# Patient Record
Sex: Female | Born: 1935 | Race: Black or African American | Hispanic: No | Smoking: Never smoker
Health system: Southern US, Community
[De-identification: ages and names within clinical notes are randomized; demographics above are authoritative.]

## PROBLEM LIST (undated history)

## (undated) DIAGNOSIS — R410 Disorientation, unspecified: Secondary | ICD-10-CM

## (undated) DIAGNOSIS — N179 Acute kidney failure, unspecified: Secondary | ICD-10-CM

## (undated) DIAGNOSIS — I10 Essential (primary) hypertension: Secondary | ICD-10-CM

## (undated) DIAGNOSIS — E348 Other specified endocrine disorders: Secondary | ICD-10-CM

## (undated) DIAGNOSIS — I4891 Unspecified atrial fibrillation: Secondary | ICD-10-CM

## (undated) DIAGNOSIS — R001 Bradycardia, unspecified: Secondary | ICD-10-CM

## (undated) DIAGNOSIS — M1990 Unspecified osteoarthritis, unspecified site: Secondary | ICD-10-CM

## (undated) HISTORY — DX: Unspecified atrial fibrillation: I48.91

## (undated) HISTORY — DX: Other specified endocrine disorders: E34.8

## (undated) HISTORY — DX: Bradycardia, unspecified: R00.1

## (undated) HISTORY — DX: Acute kidney failure, unspecified: N17.9

---

## 1998-06-13 ENCOUNTER — Ambulatory Visit (HOSPITAL_COMMUNITY): Admission: RE | Admit: 1998-06-13 | Discharge: 1998-06-13 | Payer: Self-pay | Admitting: Internal Medicine

## 1998-06-13 ENCOUNTER — Encounter: Payer: Self-pay | Admitting: Internal Medicine

## 2001-09-08 ENCOUNTER — Other Ambulatory Visit: Admission: RE | Admit: 2001-09-08 | Discharge: 2001-09-08 | Payer: Self-pay | Admitting: Internal Medicine

## 2002-10-27 ENCOUNTER — Emergency Department (HOSPITAL_COMMUNITY): Admission: EM | Admit: 2002-10-27 | Discharge: 2002-10-27 | Payer: Self-pay | Admitting: Emergency Medicine

## 2002-10-27 ENCOUNTER — Encounter: Payer: Self-pay | Admitting: Emergency Medicine

## 2003-07-05 ENCOUNTER — Encounter: Payer: Self-pay | Admitting: Emergency Medicine

## 2003-07-05 ENCOUNTER — Encounter: Payer: Self-pay | Admitting: Orthopedic Surgery

## 2003-07-05 ENCOUNTER — Observation Stay (HOSPITAL_COMMUNITY): Admission: EM | Admit: 2003-07-05 | Discharge: 2003-07-06 | Payer: Self-pay | Admitting: Emergency Medicine

## 2003-08-10 ENCOUNTER — Encounter: Admission: RE | Admit: 2003-08-10 | Discharge: 2003-11-08 | Payer: Self-pay | Admitting: Orthopedic Surgery

## 2012-09-22 ENCOUNTER — Other Ambulatory Visit: Payer: Self-pay | Admitting: Internal Medicine

## 2012-09-22 ENCOUNTER — Ambulatory Visit
Admission: RE | Admit: 2012-09-22 | Discharge: 2012-09-22 | Disposition: A | Discharge status: Home / Self Care | Payer: Self-pay | Source: Ambulatory Visit | Attending: Internal Medicine | Admitting: Internal Medicine

## 2012-09-22 DIAGNOSIS — M79673 Pain in unspecified foot: Secondary | ICD-10-CM

## 2013-04-01 ENCOUNTER — Emergency Department (HOSPITAL_COMMUNITY)
Admission: EM | Admit: 2013-04-01 | Discharge: 2013-04-01 | Discharge status: Against Medical Advice | Payer: Medicare Other | Attending: Emergency Medicine | Admitting: Emergency Medicine

## 2013-04-01 ENCOUNTER — Encounter (HOSPITAL_COMMUNITY): Payer: Self-pay | Admitting: Emergency Medicine

## 2013-04-01 ENCOUNTER — Emergency Department (HOSPITAL_COMMUNITY): Payer: Medicare Other

## 2013-04-01 DIAGNOSIS — I10 Essential (primary) hypertension: Secondary | ICD-10-CM | POA: Insufficient documentation

## 2013-04-01 DIAGNOSIS — J00 Acute nasopharyngitis [common cold]: Secondary | ICD-10-CM | POA: Insufficient documentation

## 2013-04-01 DIAGNOSIS — R6883 Chills (without fever): Secondary | ICD-10-CM | POA: Insufficient documentation

## 2013-04-01 DIAGNOSIS — N390 Urinary tract infection, site not specified: Secondary | ICD-10-CM | POA: Insufficient documentation

## 2013-04-01 DIAGNOSIS — M7989 Other specified soft tissue disorders: Secondary | ICD-10-CM | POA: Insufficient documentation

## 2013-04-01 DIAGNOSIS — R05 Cough: Secondary | ICD-10-CM | POA: Insufficient documentation

## 2013-04-01 DIAGNOSIS — R001 Bradycardia, unspecified: Secondary | ICD-10-CM

## 2013-04-01 DIAGNOSIS — R059 Cough, unspecified: Secondary | ICD-10-CM | POA: Insufficient documentation

## 2013-04-01 DIAGNOSIS — Z8739 Personal history of other diseases of the musculoskeletal system and connective tissue: Secondary | ICD-10-CM | POA: Insufficient documentation

## 2013-04-01 DIAGNOSIS — I5023 Acute on chronic systolic (congestive) heart failure: Secondary | ICD-10-CM | POA: Insufficient documentation

## 2013-04-01 HISTORY — DX: Unspecified osteoarthritis, unspecified site: M19.90

## 2013-04-01 HISTORY — DX: Essential (primary) hypertension: I10

## 2013-04-01 LAB — COMPREHENSIVE METABOLIC PANEL
ALT: 14 U/L (ref 0–35)
AST: 29 U/L (ref 0–37)
Alkaline Phosphatase: 50 U/L (ref 39–117)
CO2: 33 mEq/L — ABNORMAL HIGH (ref 19–32)
Calcium: 9 mg/dL (ref 8.4–10.5)
Chloride: 98 mEq/L (ref 96–112)
GFR calc non Af Amer: 33 mL/min — ABNORMAL LOW (ref >90)
Potassium: 3.3 mEq/L — ABNORMAL LOW (ref 3.5–5.1)
Sodium: 140 mEq/L (ref 135–145)
Total Bilirubin: 0.5 mg/dL (ref 0.3–1.2)

## 2013-04-01 LAB — URINALYSIS, ROUTINE W REFLEX MICROSCOPIC
Glucose, UA: NEGATIVE mg/dL
Ketones, ur: NEGATIVE mg/dL
pH: 7 (ref 5.0–8.0)

## 2013-04-01 LAB — URINE MICROSCOPIC-ADD ON

## 2013-04-01 LAB — CBC WITH DIFFERENTIAL/PLATELET
Basophils Absolute: 0 10*3/uL (ref 0.0–0.1)
Lymphocytes Relative: 22 % (ref 12–46)
Lymphs Abs: 0.8 10*3/uL (ref 0.7–4.0)
Neutro Abs: 2.2 10*3/uL (ref 1.7–7.7)
Neutrophils Relative %: 62 % (ref 43–77)
Platelets: 154 10*3/uL (ref 150–400)
RBC: 3.83 MIL/uL — ABNORMAL LOW (ref 3.87–5.11)
RDW: 15 % (ref 11.5–15.5)
WBC: 3.5 10*3/uL — ABNORMAL LOW (ref 4.0–10.5)

## 2013-04-01 MED ORDER — DEXTROSE 5 % IV SOLN
1.0000 g | Freq: Once | INTRAVENOUS | Status: AC
  Administered 2013-04-01: 1 g via INTRAVENOUS
  Filled 2013-04-01: qty 10

## 2013-04-01 MED ORDER — NITROFURANTOIN MONOHYD MACRO 100 MG PO CAPS: 100.0000 mg | ORAL_CAPSULE | Freq: Two times a day (BID) | ORAL | Status: DC

## 2013-04-01 NOTE — ED Notes (Addendum)
Per EMS: pt from home alone; pt sts she was cold per EMS; pt noted to be regular and brady; pt hypertensive; pt denies pain or dizziness; pt noted to have pitting edema to bilateral legs

## 2013-04-01 NOTE — ED Notes (Signed)
MD at bedside. 

## 2013-04-01 NOTE — ED Notes (Signed)
Pt leaving AMA with Pts daughter. Dr. Blinda Leatherwood, Charge Nurse and Myself reviewed risk going home and reasons for admission. Daughter states she will take her to her primary care, Dr. Girard Cooter after discharge.

## 2013-04-01 NOTE — ED Notes (Signed)
Pt gave permission for [facility] to call PCP.

## 2013-04-01 NOTE — ED Provider Notes (Signed)
History     CSN: [id number]  Arrival date & time 04/01/13  0802   First MD Initiated Contact with Patient 04/01/13 [phone]      Chief Complaint  Patient presents with  . Weakness    (Consider location/radiation/quality/duration/timing/severity/associated sxs/prior treatment) HPI Comments: Patient presents today for evaluation of weakness. The patient reports that she took this morning and felt very cold, had chills. She says it was clearly in her room, the air was blowing on her. She could not get warm, however. She noticed that she was having cough. She is not short of breath. She does, however, report that she has noticed progressively worsening swelling of her legs in the recent months. Patient got to the ER by EMS. They report the patient has been hypertensive during transport. The patient denies headache, chest pain.  Patient is a 79 y.o. female presenting with weakness.  Weakness Pertinent negatives include no chest pain.    Past Medical History  Diagnosis Date  . Hypertension   . Arthritis     History reviewed. No pertinent past surgical history.  History reviewed. No pertinent family history.  History  Substance Use Topics  . Smoking status: Never Smoker   . Smokeless tobacco: Not on file  . Alcohol Use: No    OB History   Grav Para Term Preterm Abortions TAB SAB Ect Mult Living                  Review of Systems  Constitutional: Positive for chills.  Respiratory: Positive for cough.   Cardiovascular: Positive for leg swelling. Negative for chest pain.  Gastrointestinal: Negative.   Neurological: Positive for weakness.  All other systems reviewed and are negative.    Allergies  Review of patient's allergies indicates no known allergies.  Home Medications  No current outpatient prescriptions on file.  BP 187/79  Pulse 46  Temp(Src) 97.1 F (36.2 C) (Oral)  Resp 18  SpO2 98%  Physical Exam  Constitutional: She is oriented to person, place, and  time. She appears well-developed and well-nourished. No distress.  HENT:  Head: Normocephalic and atraumatic.  Right Ear: Hearing normal.  Left Ear: Hearing normal.  Nose: Nose normal.  Mouth/Throat: Oropharynx is clear and moist and mucous membranes are normal.  Eyes: Conjunctivae and EOM are normal. Pupils are equal, round, and reactive to light.  Neck: Normal range of motion. Neck supple.  Cardiovascular: Regular rhythm, S1 normal and S2 normal.  Exam reveals no gallop and no friction rub.   No murmur heard. Pulmonary/Chest: Effort normal. No accessory muscle usage. Not tachypneic. No respiratory distress. She has rhonchi in the right lower field and the left lower field. She exhibits no tenderness.  Abdominal: Soft. Normal appearance and bowel sounds are normal. There is no hepatosplenomegaly. There is no tenderness. There is no rebound, no guarding, no tenderness at McBurney's point and negative Murphy's sign. No hernia.  Musculoskeletal: Normal range of motion. She exhibits edema.  Bilateral 4+ pitting edema  Neurological: She is alert and oriented to person, place, and time. She has normal strength. No cranial nerve deficit or sensory deficit. Coordination normal. GCS eye subscore is 4. GCS verbal subscore is 5. GCS motor subscore is 6.  Skin: Skin is warm, dry and intact. No rash noted. No cyanosis.  Psychiatric: She has a normal mood and affect. Her speech is normal and behavior is normal. Thought content normal.    ED Course  Procedures (including critical care time)  EKG:  Date: 04/01/2013  Rate: 44  Rhythm: sinus bradycardia and premature ventricular contractions (PVC)  QRS Axis: normal  Intervals: normal  ST/T Wave abnormalities: normal  Conduction Disutrbances:none  Narrative Interpretation:   Old EKG Reviewed: Bradycardia more pronounced    Labs Reviewed  CBC WITH DIFFERENTIAL - Abnormal; Notable for the following:    WBC 3.5 (*)    RBC 3.83 (*)    Hemoglobin  11.5 (*)    HCT 34.7 (*)    All other components within normal limits  COMPREHENSIVE METABOLIC PANEL - Abnormal; Notable for the following:    Potassium 3.3 (*)    CO2 33 (*)    BUN 34 (*)    Creatinine, Ser 1.34 (*)    Albumin 3.4 (*)    GFR calc non Af Amer 33 (*)    GFR calc Af Amer 38 (*)    All other components within normal limits  PRO B NATRIURETIC PEPTIDE - Abnormal; Notable for the following:    Pro B Natriuretic peptide (BNP) 497.3 (*)    All other components within normal limits  URINALYSIS, ROUTINE W REFLEX MICROSCOPIC - Abnormal; Notable for the following:    APPearance CLOUDY (*)    Hgb urine dipstick Wahler (*)    Leukocytes, UA LARGE (*)    All other components within normal limits  URINE MICROSCOPIC-ADD ON - Abnormal; Notable for the following:    Bacteria, UA FEW (*)    All other components within normal limits  URINE CULTURE  TROPONIN I   Dg Chest 2 View  04/01/2013   *RADIOLOGY REPORT*  Clinical Data: Cough and chills  CHEST - 2 VIEW  Comparison: None.  Findings: The heart and pulmonary vascularity are within normal limits.  The lungs are well-aerated bilaterally without focal infiltrate or sizable effusion.  Mild increased kyphosis is noted of a degenerative nature.  IMPRESSION: No acute abnormalities seen.   Original Report Authenticated By: Alcide Clever, M.D.     Diagnosis: 1. UTI 2. CHF 3. Bradycardia   MDM  Patient presents to the ER by ambulance. Patient is 79 years, lives alone with her daughter helping take care of her. Patient awoke this morning and said she was very cold, apparently called EMS herself. At arrival she is noted to have significant pitting edema of both lower extremities. She was also noted to be profoundly bradycardic.workup was initiated. No family members were present for the first 4 hours the patient was in the ER.  Patient is found to have urinary tract infection. Rocephin was initiated for this. BNP is somewhat elevated. She does  not have evidence of pulmonary edema, but patient has 4+ pitting edema of both lower extremities.additionally, patient's heart rate is 40-42 beats per minute. She does take Toprol XL, but she has not taken her dose this morning.  Based on the patient's bradycardia, congestive heart failure with uncontrolled blood pressure and urinary tract infection, I felt the patient should be hospitalized. Case was discussed with the hospitalist who agreed to the admission. At this point in time, however, patient's daughter finally has arrived in the ER and is demanding that the patient go home.  I had a lengthy conversation with the patient and her daughter. I explained that the patient's bradycardia could lead her to be unstable, possibly fall, hit her head. She completed her brain and died. Her heart to stop as well because she has congestive heart failure and is at risk for ventricular tachycardia. I feel that her  medications are not currently appropriately dosed for her, as she is profoundly bradycardic. She should not take any further Toprol-XL. She also has not compensated congestive heart failure on 20 mg of Lasix daily.  I had a conversation with the patient and her daughter multiple times. Each time I implored them to be admitted to the hospital. I did tell the daughter that I was concerned that the patient would die hospital. Patient and daughter indicated understanding of this. Because the patient's dementia I felt that it was important for the daughter to indicate that she understood the danger of taking her mother home. The daughter indicates that she is an LPN and understands the situation.  At this point I tried to contact the patient's primary care doctor, Doctor Renae Gloss. I was unable to get through to the office to inform her of the patient's current condition and how her health maintenance the daughter to be admitted in the hospital, or at least to be aware of the problems to address at  followup.  Unfortunately, patient does not have a power of attorney. The daughter indicates that she is the only living relative but does not have legal power of attorney. Patient is demented and does not appear to have the capacity to make decisions entirely on her own behalf.  Because of this I did discuss the case briefly with case management, the charge nurse and asked plan to help determine any legal obligations on her part. I was informed that we could not make the patient's stay in the hospital and therefore have asked the patient's daughter and the patient to sign out AGAINST MEDICAL ADVICE.    Gilda Crease, MD 04/01/13 1316

## 2013-04-02 LAB — URINE CULTURE

## 2014-11-25 DIAGNOSIS — R54 Age-related physical debility: Secondary | ICD-10-CM | POA: Diagnosis not present

## 2014-11-25 DIAGNOSIS — D518 Other vitamin B12 deficiency anemias: Secondary | ICD-10-CM | POA: Diagnosis not present

## 2014-11-25 DIAGNOSIS — I110 Hypertensive heart disease with heart failure: Secondary | ICD-10-CM | POA: Diagnosis not present

## 2014-11-25 DIAGNOSIS — N183 Chronic kidney disease, stage 3 (moderate): Secondary | ICD-10-CM | POA: Diagnosis not present

## 2014-11-25 DIAGNOSIS — F0280 Dementia in other diseases classified elsewhere without behavioral disturbance: Secondary | ICD-10-CM | POA: Diagnosis not present

## 2014-11-25 DIAGNOSIS — R627 Adult failure to thrive: Secondary | ICD-10-CM | POA: Diagnosis not present

## 2014-11-25 DIAGNOSIS — R410 Disorientation, unspecified: Secondary | ICD-10-CM | POA: Diagnosis not present

## 2014-11-25 DIAGNOSIS — R634 Abnormal weight loss: Secondary | ICD-10-CM | POA: Diagnosis not present

## 2014-11-27 ENCOUNTER — Inpatient Hospital Stay (HOSPITAL_COMMUNITY)
Admission: EM | Admit: 2014-11-27 | Discharge: 2014-11-30 | DRG: 092 | Disposition: A | Discharge status: Home / Self Care | Payer: Medicare Other | Attending: Family Medicine | Admitting: Family Medicine

## 2014-11-27 ENCOUNTER — Emergency Department (HOSPITAL_COMMUNITY): Payer: Medicare Other

## 2014-11-27 ENCOUNTER — Encounter (HOSPITAL_COMMUNITY): Payer: Self-pay

## 2014-11-27 DIAGNOSIS — M1990 Unspecified osteoarthritis, unspecified site: Secondary | ICD-10-CM | POA: Diagnosis present

## 2014-11-27 DIAGNOSIS — I129 Hypertensive chronic kidney disease with stage 1 through stage 4 chronic kidney disease, or unspecified chronic kidney disease: Secondary | ICD-10-CM | POA: Diagnosis present

## 2014-11-27 DIAGNOSIS — G92 Toxic encephalopathy: Secondary | ICD-10-CM | POA: Diagnosis not present

## 2014-11-27 DIAGNOSIS — M7500 Adhesive capsulitis of unspecified shoulder: Secondary | ICD-10-CM | POA: Diagnosis not present

## 2014-11-27 DIAGNOSIS — R4182 Altered mental status, unspecified: Secondary | ICD-10-CM | POA: Diagnosis not present

## 2014-11-27 DIAGNOSIS — E875 Hyperkalemia: Secondary | ICD-10-CM | POA: Diagnosis present

## 2014-11-27 DIAGNOSIS — E872 Acidosis: Secondary | ICD-10-CM | POA: Diagnosis present

## 2014-11-27 DIAGNOSIS — N184 Chronic kidney disease, stage 4 (severe): Secondary | ICD-10-CM | POA: Diagnosis present

## 2014-11-27 DIAGNOSIS — E876 Hypokalemia: Secondary | ICD-10-CM | POA: Diagnosis not present

## 2014-11-27 DIAGNOSIS — I10 Essential (primary) hypertension: Secondary | ICD-10-CM | POA: Diagnosis not present

## 2014-11-27 DIAGNOSIS — R410 Disorientation, unspecified: Secondary | ICD-10-CM | POA: Diagnosis not present

## 2014-11-27 DIAGNOSIS — E870 Hyperosmolality and hypernatremia: Secondary | ICD-10-CM | POA: Diagnosis not present

## 2014-11-27 DIAGNOSIS — R220 Localized swelling, mass and lump, head: Secondary | ICD-10-CM | POA: Diagnosis not present

## 2014-11-27 DIAGNOSIS — Z8249 Family history of ischemic heart disease and other diseases of the circulatory system: Secondary | ICD-10-CM

## 2014-11-27 DIAGNOSIS — Z23 Encounter for immunization: Secondary | ICD-10-CM

## 2014-11-27 DIAGNOSIS — N179 Acute kidney failure, unspecified: Secondary | ICD-10-CM | POA: Diagnosis not present

## 2014-11-27 DIAGNOSIS — G928 Other toxic encephalopathy: Secondary | ICD-10-CM | POA: Diagnosis present

## 2014-11-27 DIAGNOSIS — F0390 Unspecified dementia without behavioral disturbance: Secondary | ICD-10-CM | POA: Diagnosis not present

## 2014-11-27 DIAGNOSIS — E348 Other specified endocrine disorders: Secondary | ICD-10-CM | POA: Diagnosis present

## 2014-11-27 DIAGNOSIS — E860 Dehydration: Secondary | ICD-10-CM | POA: Diagnosis present

## 2014-11-27 DIAGNOSIS — Z79899 Other long term (current) drug therapy: Secondary | ICD-10-CM | POA: Diagnosis not present

## 2014-11-27 DIAGNOSIS — R001 Bradycardia, unspecified: Secondary | ICD-10-CM | POA: Diagnosis not present

## 2014-11-27 DIAGNOSIS — Z7982 Long term (current) use of aspirin: Secondary | ICD-10-CM

## 2014-11-27 HISTORY — DX: Disorientation, unspecified: R41.0

## 2014-11-27 LAB — COMPREHENSIVE METABOLIC PANEL
ALK PHOS: 35 U/L — AB (ref 39–117)
ALT: 19 U/L (ref 0–35)
AST: 39 U/L — ABNORMAL HIGH (ref 0–37)
Albumin: 3.9 g/dL (ref 3.5–5.2)
Anion gap: 14 (ref 5–15)
BUN: 152 mg/dL — AB (ref 6–23)
CO2: 35 mmol/L — AB (ref 19–32)
CREATININE: 3.71 mg/dL — AB (ref 0.50–1.10)
Calcium: 8.9 mg/dL (ref 8.4–10.5)
Chloride: 83 mmol/L — ABNORMAL LOW (ref 96–112)
GFR, EST AFRICAN AMERICAN: 11 mL/min — AB (ref >90)
GFR, EST NON AFRICAN AMERICAN: 10 mL/min — AB (ref >90)
GLUCOSE: 185 mg/dL — AB (ref 70–99)
POTASSIUM: 3.1 mmol/L — AB (ref 3.5–5.1)
SODIUM: 132 mmol/L — AB (ref 135–145)
TOTAL PROTEIN: 6.9 g/dL (ref 6.0–8.3)
Total Bilirubin: 0.9 mg/dL (ref 0.3–1.2)

## 2014-11-27 LAB — CBC WITH DIFFERENTIAL/PLATELET
BASOS PCT: 1 % (ref 0–1)
Basophils Absolute: 0 10*3/uL (ref 0.0–0.1)
EOS ABS: 0 10*3/uL (ref 0.0–0.7)
Eosinophils Relative: 1 % (ref 0–5)
HCT: 34.5 % — ABNORMAL LOW (ref 36.0–46.0)
Hemoglobin: 11.4 g/dL — ABNORMAL LOW (ref 12.0–15.0)
Lymphocytes Relative: 19 % (ref 12–46)
Lymphs Abs: 0.8 10*3/uL (ref 0.7–4.0)
MCH: 30.3 pg (ref 26.0–34.0)
MCHC: 33 g/dL (ref 30.0–36.0)
MCV: 91.8 fL (ref 78.0–100.0)
MONO ABS: 0.3 10*3/uL (ref 0.1–1.0)
MONOS PCT: 7 % (ref 3–12)
NEUTROS ABS: 2.9 10*3/uL (ref 1.7–7.7)
NEUTROS PCT: 72 % (ref 43–77)
Platelets: 134 10*3/uL — ABNORMAL LOW (ref 150–400)
RBC: 3.76 MIL/uL — ABNORMAL LOW (ref 3.87–5.11)
RDW: 14 % (ref 11.5–15.5)
WBC: 4 10*3/uL (ref 4.0–10.5)

## 2014-11-27 LAB — CBG MONITORING, ED: Glucose-Capillary: 162 mg/dL — ABNORMAL HIGH (ref 70–99)

## 2014-11-27 LAB — URINALYSIS, ROUTINE W REFLEX MICROSCOPIC
BILIRUBIN URINE: NEGATIVE
GLUCOSE, UA: NEGATIVE mg/dL
KETONES UR: NEGATIVE mg/dL
Leukocytes, UA: NEGATIVE
Nitrite: NEGATIVE
PROTEIN: NEGATIVE mg/dL
Specific Gravity, Urine: 1.011 (ref 1.005–1.030)
Urobilinogen, UA: 0.2 mg/dL (ref 0.0–1.0)
pH: 6 (ref 5.0–8.0)

## 2014-11-27 LAB — I-STAT CG4 LACTIC ACID, ED: LACTIC ACID, VENOUS: 1.54 mmol/L (ref 0.5–2.0)

## 2014-11-27 LAB — URINE MICROSCOPIC-ADD ON

## 2014-11-27 MED ORDER — INFLUENZA VAC SPLIT QUAD 0.5 ML IM SUSY
0.5000 mL | PREFILLED_SYRINGE | INTRAMUSCULAR | Status: AC
  Administered 2014-11-28: 0.5 mL via INTRAMUSCULAR
  Filled 2014-11-27 (×2): qty 0.5

## 2014-11-27 MED ORDER — SODIUM CHLORIDE 0.9 % IV SOLN
INTRAVENOUS | Status: DC
  Administered 2014-11-27: 17:00:00 via INTRAVENOUS

## 2014-11-27 MED ORDER — PNEUMOCOCCAL VAC POLYVALENT 25 MCG/0.5ML IJ INJ
0.5000 mL | INJECTION | INTRAMUSCULAR | Status: AC
  Administered 2014-11-28: 0.5 mL via INTRAMUSCULAR
  Filled 2014-11-27 (×2): qty 0.5

## 2014-11-27 MED ORDER — SODIUM CHLORIDE 0.9 % IV SOLN
INTRAVENOUS | Status: DC
  Administered 2014-11-28 – 2014-11-29 (×4): via INTRAVENOUS

## 2014-11-27 MED ORDER — HEPARIN SODIUM (PORCINE) 5000 UNIT/ML IJ SOLN
5000.0000 [IU] | Freq: Three times a day (TID) | INTRAMUSCULAR | Status: DC
  Administered 2014-11-27 – 2014-11-30 (×8): 5000 [IU] via SUBCUTANEOUS
  Filled 2014-11-27 (×8): qty 1

## 2014-11-27 MED ORDER — ASPIRIN EC 81 MG PO TBEC
81.0000 mg | DELAYED_RELEASE_TABLET | Freq: Every day | ORAL | Status: DC
  Administered 2014-11-27 – 2014-11-30 (×4): 81 mg via ORAL
  Filled 2014-11-27 (×4): qty 1

## 2014-11-27 MED ORDER — AMLODIPINE BESYLATE 5 MG PO TABS
2.5000 mg | ORAL_TABLET | Freq: Every day | ORAL | Status: DC
  Administered 2014-11-28 – 2014-11-29 (×2): 2.5 mg via ORAL
  Filled 2014-11-27 (×2): qty 1

## 2014-11-27 MED ORDER — POTASSIUM CHLORIDE CRYS ER 20 MEQ PO TBCR
40.0000 meq | EXTENDED_RELEASE_TABLET | Freq: Once | ORAL | Status: AC
Start: 2014-11-27 — End: 2014-11-27
  Administered 2014-11-27: 40 meq via ORAL
  Filled 2014-11-27: qty 2

## 2014-11-27 MED ORDER — CETYLPYRIDINIUM CHLORIDE 0.05 % MT LIQD
7.0000 mL | Freq: Two times a day (BID) | OROMUCOSAL | Status: DC
  Administered 2014-11-27 – 2014-11-30 (×6): 7 mL via OROMUCOSAL

## 2014-11-27 MED ORDER — ENSURE COMPLETE PO LIQD
237.0000 mL | Freq: Two times a day (BID) | ORAL | Status: DC
  Administered 2014-11-28 – 2014-11-30 (×4): 237 mL via ORAL

## 2014-11-27 NOTE — ED Notes (Signed)
MD Belfi at bedside. 

## 2014-11-27 NOTE — ED Notes (Addendum)
Pt alert and answering questions appropriately. Warm blanket provided. Pt is sinus brady at rate of 50.  Family updated about awaiting results.

## 2014-11-27 NOTE — ED Provider Notes (Signed)
CSN: [id number]     Arrival date & time 11/27/14  1503 History   First MD Initiated Contact with Patient 11/27/14 1533     Chief Complaint  Patient presents with  . Altered Mental Status  . Abnormal Lab     (Consider location/radiation/quality/duration/timing/severity/associated sxs/prior Treatment) HPI Comments: Pt with a hx of HTN presents with confusion.  She lives at home alone, just down from her daughter.  Her daughter has noted her to have some increase in confusion since after Christmas with some worsening over last couple of weeks.  She has lost about 20 pounds since November due to decreased appetitie.  She says that pt has not been eating well.  Has been generally weaker, but still able to ambulate on her own with a cane which is her baseline.  Pt denies any pain.  No CP or SOB, no fevers or UTI symptoms.  No abd pain.  No n/v.  Some occassional loose stools.  Was seen by her PCP on Thursday and was called today and told that her labs were abnormal and that she needed fluids.  Patient is a 81 y.o. female presenting with altered mental status.  Altered Mental Status Associated symptoms: weakness (generalized)   Associated symptoms: no abdominal pain, no fever, no headaches, no nausea, no rash and no vomiting     Past Medical History  Diagnosis Date  . Hypertension   . Arthritis   . Confusion    Past Surgical History  Procedure Laterality Date  . Cesarean section     History reviewed. No pertinent family history. History  Substance Use Topics  . Smoking status: Never Smoker   . Smokeless tobacco: Not on file  . Alcohol Use: No   OB History    No data available     Review of Systems  Constitutional: Positive for activity change, appetite change, fatigue and unexpected weight change. Negative for fever, chills and diaphoresis.  HENT: Negative for congestion, rhinorrhea and sneezing.   Eyes: Negative.   Respiratory: Positive for cough (mild). Negative for chest  tightness and shortness of breath.   Cardiovascular: Negative for chest pain and leg swelling.  Gastrointestinal: Negative for nausea, vomiting, abdominal pain, diarrhea and blood in stool.  Genitourinary: Negative for frequency, hematuria, flank pain and difficulty urinating.  Musculoskeletal: Negative for back pain and arthralgias.  Skin: Negative for rash.  Neurological: Positive for weakness (generalized). Negative for dizziness, speech difficulty, numbness and headaches.      Allergies  Review of patient's allergies indicates no known allergies.  Home Medications   Prior to Admission medications   Medication Sig Start Date End Date Taking? Authorizing Provider  aspirin 81 MG tablet Take 81 mg by mouth daily.   Yes Historical Provider, MD  bumetanide (BUMEX) 2 MG tablet Take 2 mg by mouth daily.   Yes Historical Provider, MD  metoprolol succinate (TOPROL-XL) 100 MG 24 hr tablet Take 100 mg by mouth daily. Take with or immediately following a meal.   Yes Historical Provider, MD  valsartan-hydrochlorothiazide (DIOVAN-HCT) 320-25 MG per tablet Take 1 tablet by mouth daily.   Yes Historical Provider, MD  nitrofurantoin, macrocrystal-monohydrate, (MACROBID) 100 MG capsule Take 1 capsule (100 mg total) by mouth 2 (two) times daily. 04/01/13   Gilda Crease, MD   BP 115/56 mmHg  Pulse 55  Temp(Src) 97.7 F (36.5 C) (Oral)  Resp 12  SpO2 100% Physical Exam  Constitutional: She is oriented to person, place, and time. She appears  well-developed and well-nourished.  HENT:  Head: Normocephalic and atraumatic.  Eyes: Pupils are equal, round, and reactive to light.  Neck: Normal range of motion. Neck supple.  Cardiovascular: Normal rate, regular rhythm and normal heart sounds.   Pulmonary/Chest: Effort normal and breath sounds normal. No respiratory distress. She has no wheezes. She has no rales. She exhibits no tenderness.  Abdominal: Soft. Bowel sounds are normal. There is no  tenderness. There is no rebound and no guarding.  Musculoskeletal: Normal range of motion. She exhibits no edema.  Lymphadenopathy:    She has no cervical adenopathy.  Neurological: She is alert and oriented to person, place, and time.  Knows person/place/month/year, but not date. Motor 5/5 all extremities.  Sensation grossly intact to LT all extremities.  FTN intact, no pronator drift.  CN 2-12 grossly intact  Skin: Skin is warm and dry. No rash noted.  Psychiatric: She has a normal mood and affect.    ED Course  Procedures (including critical care time) Labs Review Labs Reviewed  COMPREHENSIVE METABOLIC PANEL - Abnormal; Notable for the following:    Sodium 132 (*)    Potassium 3.1 (*)    Chloride 83 (*)    CO2 35 (*)    Glucose, Bld 185 (*)    BUN 152 (*)    Creatinine, Ser 3.71 (*)    AST 39 (*)    Alkaline Phosphatase 35 (*)    GFR calc non Af Amer 10 (*)    GFR calc Af Amer 11 (*)    All other components within normal limits  CBC WITH DIFFERENTIAL/PLATELET - Abnormal; Notable for the following:    RBC 3.76 (*)    Hemoglobin 11.4 (*)    HCT 34.5 (*)    Platelets 134 (*)    All other components within normal limits  URINALYSIS, ROUTINE W REFLEX MICROSCOPIC - Abnormal; Notable for the following:    Hgb urine dipstick Delval (*)    All other components within normal limits  CBG MONITORING, ED - Abnormal; Notable for the following:    Glucose-Capillary 162 (*)    All other components within normal limits  URINE MICROSCOPIC-ADD ON  I-STAT CG4 LACTIC ACID, ED    Imaging Review Dg Chest 2 View  11/27/2014   CLINICAL DATA:  Increasing confusion over the past 1-2 months. Acute renal insufficiency. Hypernatremia. Hypokalemia. Current history of hypertension.  EXAM: CHEST  2 VIEW  COMPARISON:  04/01/2013.  FINDINGS: Cardiac silhouette upper normal in size. Thoracic aorta tortuous and atherosclerotic, unchanged. Hilar and mediastinal contours otherwise unremarkable. Lungs clear.  Bronchovascular markings normal. Pulmonary vascularity normal. No visible pleural effusions. No pneumothorax. Degenerative changes involving the thoracic spine and exaggeration of the usual thoracic kyphosis.  IMPRESSION: No acute cardiopulmonary disease.   Electronically Signed   By: Hulan Saashomas  Lawrence M.D.   On: 11/27/2014 17:07   Ct Head Wo Contrast  11/27/2014   CLINICAL DATA:  Confusion, altered mental status. History of hypertension.  EXAM: CT HEAD WITHOUT CONTRAST  TECHNIQUE: Contiguous axial images were obtained from the base of the skull through the vertex without intravenous contrast.  COMPARISON:  None.  FINDINGS: Mild cortical volume loss noted with proportional ventricular prominence. Areas of periventricular white matter hypodensity are most compatible with Sorg vessel ischemic change. No acute hemorrhage or infarct is identified. There is a calcified mass in the region of the pineal gland measuring 1.7 x 1.5 cm.  IMPRESSION: No acute intracranial finding. 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 Further evaluation at brain MRI with contrast is recommended. These results were called by telephone at the time of interpretation on 11/27/2014 at 5:14 pm to Dr. Shawna Orleans Shakeila Pfarr , who verbally acknowledged these results.   Electronically Signed   By: Christiana Pellant M.D.   On: 11/27/2014 17:15     EKG Interpretation None      Date: 11/27/2014  Rate: 52  Rhythm: indeterminate appears to be junctional rhythm  QRS Axis: normal  Intervals: normal  ST/T Wave abnormalities: nonspecific ST/T changes  Conduction Disutrbances:none  Narrative Interpretation:   Old EKG Reviewed: unchanged   MDM   Final diagnoses:  Dehydration  AKI (acute kidney injury)  Hypokalemia    Patient with a markedly elevated BUNs and elevated creatinine with acute  kidney injury as compared to her baseline creatinine. She also has some mild hypokalemia. She was given potassium replacement and started on IV fluids. She has no evidence of pneumonia. She has an abnormal mass in her pineal gland which will need further imaging with MRI. I spoke with Dr. Burnett Harry who will admit the patient for further treatment.    Rolan Bucco, MD 11/27/14 [phone]

## 2014-11-27 NOTE — ED Notes (Signed)
MD Samtani at bedside 

## 2014-11-27 NOTE — ED Notes (Signed)
Pt ambulated to restroom with the assistance of daughter and granddaughter

## 2014-11-27 NOTE — ED Notes (Addendum)
4th Floor reports they must reassign bed. Will await for new bed assignment.

## 2014-11-27 NOTE — ED Notes (Signed)
Pt sent by PCP, due to abnormal lab values.  Pt's daughter reports PCP visit, due to increased confusion since December.  PCP reported elevated BUN, creatinine, sodium, and decreased potassium.  Hx of confusion.

## 2014-11-27 NOTE — H&P (Signed)
[facility] History and Physical  Rachel Hickman:[id number] DOB: 01-26-1917 DOA: 11/27/2014  Referring physician: ED PCP: No primary care provider on file.  Specialists: none  Chief Complaint: Abnormal labs  HPI: pleasant 81 year old female with history of hypertension, lower extremity swelling,otherwise review bland past medical history admitted from home 11/27/14 after being called by nurse practitioner regarding abnormal labs that were performed on 11/25/14 Patient apparently has been increasingly confused since November which coincides with the time that the patient's Lasix was transitioned over to Bumex/  There is previous documentation that the patient came to the emergency room because patient is feeling cold and was found to be profoundly bradycardic. Patient currently takes Bumex 2 mg daily and has had an improvement in her lower extremity edema over the past month where she is finally able to put back on her shoes. Unfortunately patient also is on Diovan HCT which is a diuretic as well and has become increasingly confused At baseline she is able to participate in cooking and cleaning doing stuff for herself. She is unable to drive at present She is able to give me a history but thinks that this is 1958 and cannot tell me the president's name and this is not her baseline according to her daughter-confusion worsened over the last 1 month coinciding with bumex use  Review of Systems:  Unrelaiable  Past Medical History  Diagnosis Date  . Hypertension   . Arthritis   . Confusion    Past Surgical History  Procedure Laterality Date  . Cesarean section     Social History:  History   Social History Narrative    No Known Allergies  Family History  Problem Relation Age of Onset  . Hypertension       Prior to Admission medications   Medication Sig Start Date End Date Taking? Authorizing Provider  aspirin 81 MG tablet Take 81 mg by mouth daily.   Yes Historical Provider,  MD  bumetanide (BUMEX) 2 MG tablet Take 2 mg by mouth daily.   Yes Historical Provider, MD  metoprolol succinate (TOPROL-XL) 100 MG 24 hr tablet Take 100 mg by mouth daily. Take with or immediately following a meal.   Yes Historical Provider, MD  valsartan-hydrochlorothiazide (DIOVAN-HCT) 320-25 MG per tablet Take 1 tablet by mouth daily.   Yes Historical Provider, MD  nitrofurantoin, macrocrystal-monohydrate, (MACROBID) 100 MG capsule Take 1 capsule (100 mg total) by mouth 2 (two) times daily. 04/01/13   Gilda Crease, MD   Physical Exam: Filed Vitals:   11/27/14 1518 11/27/14 1745  BP: 115/56   Pulse: 55   Temp: 97.7 F (36.5 C)   TempSrc: Oral   Resp: 16 12  SpO2: 100%    EOM, NCAT Poor skin turgor Clear chest, no rales s1 s2 no m/r/g, no bruit, no jvd Arcus senilis Surprisingly good dentition No abd tenderness Neuro intact No Le edema   Labs on Admission:  Basic Metabolic Panel:  Recent Labs Lab 11/27/14 1549  NA 132*  K 3.1*  CL 83*  CO2 35*  GLUCOSE 185*  BUN 152*  CREATININE 3.71*  CALCIUM 8.9   Liver Function Tests:  Recent Labs Lab 11/27/14 1549  AST 39*  ALT 19  ALKPHOS 35*  BILITOT 0.9  PROT 6.9  ALBUMIN 3.9   No results for input(s): LIPASE, AMYLASE in the last 168 hours. No results for input(s): AMMONIA in the last 168 hours. CBC:  Recent Labs Lab 11/27/14 1549  WBC 4.0  NEUTROABS 2.9  HGB 11.4*  HCT 34.5*  MCV 91.8  PLT 134*   Cardiac Enzymes: No results for input(s): CKTOTAL, CKMB, CKMBINDEX, TROPONINI in the last 168 hours.  BNP (last 3 results) No results for input(s): BNP in the last 8760 hours.  ProBNP (last 3 results) No results for input(s): PROBNP in the last 8760 hours.  CBG:  Recent Labs Lab 11/27/14 1608  GLUCAP 162*    Radiological Exams on Admission: Dg Chest 2 View  11/27/2014   CLINICAL DATA:  Increasing confusion over the past 1-2 months. Acute renal insufficiency. Hypernatremia. Hypokalemia.  Current history of hypertension.  EXAM: CHEST  2 VIEW  COMPARISON:  04/01/2013.  FINDINGS: Cardiac silhouette upper normal in size. Thoracic aorta tortuous and atherosclerotic, unchanged. Hilar and mediastinal contours otherwise unremarkable. Lungs clear. Bronchovascular markings normal. Pulmonary vascularity normal. No visible pleural effusions. No pneumothorax. Degenerative changes involving the thoracic spine and exaggeration of the usual thoracic kyphosis.  IMPRESSION: No acute cardiopulmonary disease.   Electronically Signed   By: Hulan Saashomas  Lawrence M.D.   On: 11/27/2014 17:07   Ct Head Wo Contrast  11/27/2014   CLINICAL DATA:  Confusion, altered mental status. History of hypertension.  EXAM: CT HEAD WITHOUT CONTRAST  TECHNIQUE: Contiguous axial images were obtained from the base of the skull through the vertex without intravenous contrast.  COMPARISON:  None.  FINDINGS: Mild cortical volume loss noted with proportional ventricular prominence. Areas of periventricular white matter hypodensity are most compatible with Esters vessel ischemic change. No acute hemorrhage or infarct is identified. There is a calcified mass in the region of the pineal gland measuring 1.7 x 1.5 cm.  IMPRESSION: No acute intracranial finding. Calcified 1.7 cm mass in the region of the pineal gland, for which typical differential considerations include meningioma, pineoblastoma, germinoma or other germ-cell tumor, or metastatic disease. Further evaluation at brain MRI with contrast is recommended. These results were called by telephone at the time of interpretation on 11/27/2014 at 5:14 pm to Dr. Shawna OrleansMELANIE BELFI , who verbally acknowledged these results.   Electronically Signed   By: Christiana PellantGretchen  Green M.D.   On: 11/27/2014 17:15    EKG: Independently reviewed. Sinus brDY, NO PAUSES  Assessment/Plan Principal Problem:   Toxic metabolic encephalopathy-2/2 to AKI. Replete c saline, labs am, i/o. Daily weight Should not be d/c on  ACe/Bumex-- ? Heart failure? discussion c PCP as OP re: further work up ie echo etc etc Active Problems:   Acute kidney injury-see above.  Monitor fluid status carefully   Bradycardia-should not be d/c on Metoprolol xl 100.  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 SHe is understandably worried about her. Daughter is NOK as sole child, but doesn't;t hold HCPOA.  SHe agrees that mother ischronologically close to her natural end of lifespan.  We will continue discussions tomorrow She remains full code  Rhetta MuraSAMTANI, JAI-GURMUKH [facility] Pager [phone]  If 7PM-7AM, please contact night-coverage [email or web address] Password [email or web address] 11/27/2014, 6:18 PM

## 2014-11-28 DIAGNOSIS — M1990 Unspecified osteoarthritis, unspecified site: Secondary | ICD-10-CM | POA: Diagnosis not present

## 2014-11-28 DIAGNOSIS — I129 Hypertensive chronic kidney disease with stage 1 through stage 4 chronic kidney disease, or unspecified chronic kidney disease: Secondary | ICD-10-CM | POA: Diagnosis not present

## 2014-11-28 DIAGNOSIS — Z7982 Long term (current) use of aspirin: Secondary | ICD-10-CM | POA: Diagnosis not present

## 2014-11-28 DIAGNOSIS — E348 Other specified endocrine disorders: Secondary | ICD-10-CM | POA: Diagnosis not present

## 2014-11-28 DIAGNOSIS — E876 Hypokalemia: Secondary | ICD-10-CM | POA: Diagnosis not present

## 2014-11-28 DIAGNOSIS — E875 Hyperkalemia: Secondary | ICD-10-CM | POA: Diagnosis not present

## 2014-11-28 DIAGNOSIS — Z23 Encounter for immunization: Secondary | ICD-10-CM | POA: Diagnosis not present

## 2014-11-28 DIAGNOSIS — Z79899 Other long term (current) drug therapy: Secondary | ICD-10-CM | POA: Diagnosis not present

## 2014-11-28 DIAGNOSIS — N184 Chronic kidney disease, stage 4 (severe): Secondary | ICD-10-CM | POA: Diagnosis not present

## 2014-11-28 DIAGNOSIS — F0390 Unspecified dementia without behavioral disturbance: Secondary | ICD-10-CM | POA: Diagnosis not present

## 2014-11-28 DIAGNOSIS — G92 Toxic encephalopathy: Secondary | ICD-10-CM | POA: Diagnosis not present

## 2014-11-28 DIAGNOSIS — M7500 Adhesive capsulitis of unspecified shoulder: Secondary | ICD-10-CM | POA: Diagnosis not present

## 2014-11-28 DIAGNOSIS — N179 Acute kidney failure, unspecified: Secondary | ICD-10-CM | POA: Diagnosis not present

## 2014-11-28 DIAGNOSIS — Z8249 Family history of ischemic heart disease and other diseases of the circulatory system: Secondary | ICD-10-CM | POA: Diagnosis not present

## 2014-11-28 DIAGNOSIS — E860 Dehydration: Secondary | ICD-10-CM | POA: Diagnosis not present

## 2014-11-28 DIAGNOSIS — E872 Acidosis: Secondary | ICD-10-CM | POA: Diagnosis not present

## 2014-11-28 DIAGNOSIS — R001 Bradycardia, unspecified: Secondary | ICD-10-CM | POA: Diagnosis not present

## 2014-11-28 LAB — BASIC METABOLIC PANEL
Anion gap: 16 — ABNORMAL HIGH (ref 5–15)
BUN: 137 mg/dL — ABNORMAL HIGH (ref 6–23)
CO2: 33 mmol/L — ABNORMAL HIGH (ref 19–32)
CREATININE: 2.88 mg/dL — AB (ref 0.50–1.10)
Calcium: 8.9 mg/dL (ref 8.4–10.5)
Chloride: 88 mmol/L — ABNORMAL LOW (ref 96–112)
GFR calc Af Amer: 15 mL/min — ABNORMAL LOW (ref >90)
GFR calc non Af Amer: 13 mL/min — ABNORMAL LOW (ref >90)
Glucose, Bld: 104 mg/dL — ABNORMAL HIGH (ref 70–99)
Potassium: 3.3 mmol/L — ABNORMAL LOW (ref 3.5–5.1)
Sodium: 137 mmol/L (ref 135–145)

## 2014-11-28 LAB — CBC
HCT: 34.4 % — ABNORMAL LOW (ref 36.0–46.0)
HEMOGLOBIN: 11.4 g/dL — AB (ref 12.0–15.0)
MCH: 30.2 pg (ref 26.0–34.0)
MCHC: 33.1 g/dL (ref 30.0–36.0)
MCV: 91 fL (ref 78.0–100.0)
Platelets: 136 10*3/uL — ABNORMAL LOW (ref 150–400)
RBC: 3.78 MIL/uL — ABNORMAL LOW (ref 3.87–5.11)
RDW: 13.8 % (ref 11.5–15.5)
WBC: 3.4 10*3/uL — ABNORMAL LOW (ref 4.0–10.5)

## 2014-11-28 NOTE — Evaluation (Signed)
Physical Therapy Evaluation Patient Details Name: Rachel RoesLois D Hickman MRN: [id number] DOB: [birth year] Today's Date: 11/28/2014   History of Present Illness  Pt admitted s/p increasing confusion with abnormal lab values  Clinical Impression  Pt admitted with dx of toxic metabolic encephalopathy and presenting with functional mobility limitations 2* generalized weakness, ambulatory instability, poor safety awareness and questionable cognitive status.  Pt currently unsteady with mobility and at high risk of falling.  Pt would benefit from follow up rehab at SNF level to maximize IND and safety unless pt's family can provide 24/7 assist.    Follow Up Recommendations Home health PT;SNF;Supervision/Assistance - 24 hour    Equipment Recommendations  Rolling walker with 5" wheels (Pt unclear on RW at home - will require to return home)    Recommendations for Other Services OT consult     Precautions / Restrictions Precautions Precautions: Fall Restrictions Weight Bearing Restrictions: No      Mobility  Bed Mobility Overal bed mobility: Needs Assistance Bed Mobility: Supine to Sit     Supine to sit: Supervision     General bed mobility comments: pt struggling to side of bed; requires cues to use UEs to self assist  Transfers Overall transfer level: Needs assistance Equipment used: Rolling walker (2 wheeled) Transfers: Sit to/from UGI CorporationStand;Stand Pivot Transfers Sit to Stand: Mod assist Stand pivot transfers: Min assist;Mod assist       General transfer comment: cues for transition position, saftey  anduse of UEs to self assist.  Mod assist to bring wt up over feet and min assist to steady and correct for inital post drift.    Ambulation/Gait Ambulation/Gait assistance: Min assist;Mod assist Ambulation Distance (Feet): 175 Feet Assistive device: Rolling walker (2 wheeled);Straight cane Gait Pattern/deviations: Step-through pattern;Decreased step length - right;Decreased step length -  left;Shuffle;Staggering left;Staggering right;Trunk flexed;Narrow base of support Gait velocity: decr   General Gait Details: Cane present in pt room and initally attempted for ambulation.  Pt very unsteady in all directions with use of cane.  Transitioned to RW with marked improvement in pt stability - pt still reuiring min assist for stability and safe walker management.  Stairs            Wheelchair Mobility    Modified Rankin (Stroke Patients Only)       Balance Overall balance assessment: Needs assistance Sitting-balance support: Feet supported Sitting balance-Leahy Scale: Good     Standing balance support: Bilateral upper extremity supported Standing balance-Leahy Scale: Poor Standing balance comment: High fall risk                             Pertinent Vitals/Pain Pain Assessment: No/denies pain    Home Living Family/patient expects to be discharged to:: Unsure Living Arrangements: Alone               Additional Comments: Pt states her dtr "lives on the same street" and she thinks she has a walker at home.  Pt is not reliable historian based on comments regarding parents - pt is 81 yrs old    Prior Function Level of Independence: Independent with assistive device(s)         Comments: per pt report      Hand Dominance        Extremity/Trunk Assessment   Upper Extremity Assessment: Generalized weakness           Lower Extremity Assessment: Generalized weakness      Cervical /  Trunk Assessment: Kyphotic  Communication   Communication: No difficulties  Cognition Arousal/Alertness: Awake/alert Behavior During Therapy: WFL for tasks assessed/performed Overall Cognitive Status: No family/caregiver present to determine baseline cognitive functioning                      General Comments      Exercises        Assessment/Plan    PT Assessment Patient needs continued PT services  PT Diagnosis Difficulty walking    PT Problem List Decreased strength;Decreased activity tolerance;Decreased balance;Decreased mobility;Decreased knowledge of use of DME;Decreased safety awareness  PT Treatment Interventions DME instruction;Gait training;Functional mobility training;Therapeutic activities;Therapeutic exercise;Patient/family education   PT Goals (Current goals can be found in the Care Plan section) Acute Rehab PT Goals Patient Stated Goal: No specific goals stated PT Goal Formulation: Patient unable to participate in goal setting Time For Goal Achievement: 12/12/14 Potential to Achieve Goals: Fair    Frequency Min 3X/week   Barriers to discharge Decreased caregiver support Pt lives alone, dtr lives near by but works as Public house manager in SNF    Co-evaluation               End of Session Equipment Utilized During Treatment: Gait belt Activity Tolerance: Patient tolerated treatment well Patient left: in chair;with call bell/phone within reach Nurse Communication: Mobility status         Time: 6045-4098 PT Time Calculation (min) (ACUTE ONLY): 39 min   Charges:   PT Evaluation $Initial PT Evaluation Tier I: 1 Procedure PT Treatments $Gait Training: 8-22 mins $Therapeutic Activity: 8-22 mins   PT G Codes:        Rachel Hickman December 10, 2014, 12:13 PM

## 2014-11-28 NOTE — Progress Notes (Signed)
Patient had 8 beats of Vtach. Patient asymptomatic. VSS. NP on call made aware. No new orders placed. Will continue to monitor closely.

## 2014-11-28 NOTE — Progress Notes (Signed)
Rachel Hickman [place] AOZ:[id number] DOB: September 02, 1917 DOA: 11/27/2014 PCP: No primary care provider on file.  Brief narrative:  81 year old female with history of hypertension, lower extremity swelling,otherwise review bland past medical history admitted from home 11/27/14 after being called by nurse practitioner regarding abnormal labs that were performed on 11/25/14 Patient apparently has been increasingly confused since November which coincides with the time that the patient's Lasix was transitioned over to Bumex She was admitted with severe renal insufficiency, BUN 152/3.7 Lactic acidosis 1.5 Baseline kidney function is 34/1.3 on 04/01/13  Past medical history-As per Problem list Chart reviewed as below-   Consultants:  none  Procedures:  none  Antibiotics:  none   Subjective   Pleasantly confused No distress currently Tolerating diet    Objective    Interim History:   Telemetry: Sinus bradycardia but improved from admission   Objective: Filed Vitals:   11/27/14 1930 11/28/14 0555 11/28/14 0625 11/28/14 1057  BP: 146/72 131/71  118/69  Pulse: 100 56    Temp: 97.6 F (36.4 C) 97.8 F (36.6 C)    TempSrc: Oral Oral    Resp: 18 18    Height: 5\' 4"  (1.626 m)     Weight: 50.576 kg (111 lb 8 oz)  51.075 kg (112 lb 9.6 oz)   SpO2: 100% 96%      Intake/Output Summary (Last 24 hours) at 11/28/14 1353 Last data filed at 11/28/14 0700  Gross per 24 hour  Intake   1065 ml  Output      0 ml  Net   1065 ml    Exam:  General: EOMI NCAT Cardiovascular: S1-S2 no murmur rub or gallop Respiratory: Clinically clear Abdomen: Soft nontender no distention no venous stasis Skin Neuro intact  Data Reviewed: Basic Metabolic Panel:  Recent Labs Lab 11/27/14 1549 11/28/14 0555  NA 132* 137  K 3.1* 3.3*  CL 83* 88*  CO2 35* 33*  GLUCOSE 185* 104*  BUN 152* 137*  CREATININE 3.71* 2.88*  CALCIUM 8.9 8.9   Liver Function Tests:  Recent Labs Lab 11/27/14 1549  AST  39*  ALT 19  ALKPHOS 35*  BILITOT 0.9  PROT 6.9  ALBUMIN 3.9   No results for input(s): LIPASE, AMYLASE in the last 168 hours. No results for input(s): AMMONIA in the last 168 hours. CBC:  Recent Labs Lab 11/27/14 1549 11/28/14 0555  WBC 4.0 3.4*  NEUTROABS 2.9  --   HGB 11.4* 11.4*  HCT 34.5* 34.4*  MCV 91.8 91.0  PLT 134* 136*   Cardiac Enzymes: No results for input(s): CKTOTAL, CKMB, CKMBINDEX, TROPONINI in the last 168 hours. BNP: Invalid input(s): POCBNP CBG:  Recent Labs Lab 11/27/14 1608  GLUCAP 162*    No results found for this or any previous visit (from the past 240 hour(s)).   Studies:              All Imaging reviewed and is as per above notation   Scheduled Meds: . amLODipine  2.5 mg Oral Daily  . antiseptic oral rinse  7 mL Mouth Rinse BID  . aspirin EC  81 mg Oral Daily  . feeding supplement (ENSURE COMPLETE)  237 mL Oral BID BM  . heparin  5,000 Units Subcutaneous 3 times per day   Continuous Infusions: . sodium chloride 75 mL/hr at 11/28/14 0515     Assessment/Plan:  1. Aki/ Ck-Glt Equation CKD stg IV-patient will be discontinued forever off of ACE inhibitor, thiazides, Bumex. Continue gentle saline  75 cc an hour for 1 more day. Creatinine already trending down well. 2. Hypertension continue amlodipine 2.5 mg daily 3. Hyperkalemia-Kdur 40 daily 4. Moderate to severe dementia-reorient daily. Consider Namenda as an outpatient?  Code Status: Full code Family CommunicationRennie Plowman Daughter [phone]  Disposition Plan: ? Hickman/c home am   Pleas Koch, MD  [facility] Pager [phone] 11/28/2014, 1:53 PM    LOS: 1 day

## 2014-11-29 LAB — BASIC METABOLIC PANEL
Anion gap: 10 (ref 5–15)
BUN: 102 mg/dL — ABNORMAL HIGH (ref 6–23)
CHLORIDE: 99 mmol/L (ref 96–112)
CO2: 31 mmol/L (ref 19–32)
CREATININE: 1.93 mg/dL — AB (ref 0.50–1.10)
Calcium: 8.9 mg/dL (ref 8.4–10.5)
GFR calc Af Amer: 24 mL/min — ABNORMAL LOW (ref >90)
GFR calc non Af Amer: 21 mL/min — ABNORMAL LOW (ref >90)
Glucose, Bld: 119 mg/dL — ABNORMAL HIGH (ref 70–99)
POTASSIUM: 3.5 mmol/L (ref 3.5–5.1)
Sodium: 140 mmol/L (ref 135–145)

## 2014-11-29 MED ORDER — ACETAMINOPHEN 500 MG PO TABS
500.0000 mg | ORAL_TABLET | ORAL | Status: DC | PRN
  Administered 2014-11-29: 500 mg via ORAL
  Filled 2014-11-29: qty 1

## 2014-11-29 MED ORDER — CAPSICUM OLEORESIN 0.025 % EX CREA
TOPICAL_CREAM | Freq: Two times a day (BID) | CUTANEOUS | Status: DC
  Administered 2014-11-29 – 2014-11-30 (×2): via TOPICAL
  Filled 2014-11-29: qty 60

## 2014-11-29 MED ORDER — CAPSAICIN 0.075 % EX CREA
TOPICAL_CREAM | Freq: Two times a day (BID) | CUTANEOUS | Status: DC
  Filled 2014-11-29: qty 60

## 2014-11-29 NOTE — Progress Notes (Signed)
Physical Therapy Treatment Patient Details Name: Rachel Hickman MRN: [id number] DOB: August 31, 1917 Today's Date: 11/29/2014    History of Present Illness 81 yo admitted  11/27/14 with toxic metabolic encephalopathy (increasing confusion) d/t AKI; PMHx: HTN, confusion    PT Comments    Pt progressing; family present and encouraging her participation; continue to recommend  24hr care--HHPT vs SNF  Follow Up Recommendations  Home health PT;SNF;Supervision/Assistance - 24 hour     Equipment Recommendations  Rolling walker with 5" wheels    Recommendations for Other Services       Precautions / Restrictions Precautions Precautions: Fall    Mobility  Bed Mobility               General bed mobility comments: pt OOB  Transfers Overall transfer level: Needs assistance Equipment used: Rolling walker (2 wheeled) Transfers: Sit to/from Stand Sit to Stand: Min assist;Mod assist         General transfer comment: cues for safety, control of descent, pt dtr has her count to 10 to stand hand pt uses a great deal of momentum to rise  Ambulation/Gait Ambulation/Gait assistance: Mod assist;Min assist Ambulation Distance (Feet): 180 Feet Assistive device: Rolling walker (2 wheeled);Straight cane Gait Pattern/deviations: Step-through pattern;Drifts right/left;Trunk flexed Gait velocity: decr   General Gait Details: pt takes excessively large steps, requires assist throughout for balance and RW direction   Stairs            Wheelchair Mobility    Modified Rankin (Stroke Patients Only)       Balance Overall balance assessment: Needs assistance         Standing balance support: Bilateral upper extremity supported Standing balance-Leahy Scale: Poor                      Cognition Arousal/Alertness: Awake/alert Behavior During Therapy: WFL for tasks assessed/performed Overall Cognitive Status: Impaired/Different from baseline Area of Impairment:  Orientation;Following commands;Problem solving;Safety/judgement Orientation Level: Disoriented to;Place;Time;Situation     Following Commands: Follows one step commands with increased time Safety/Judgement: Decreased awareness of safety;Decreased awareness of deficits   Problem Solving: Slow processing;Decreased initiation;Requires verbal cues;Requires tactile cues      Exercises      General Comments        Pertinent Vitals/Pain Pain Assessment: Faces Faces Pain Scale: Hurts little more Pain Location: pt does not state Pain Intervention(s): Limited activity within patient's tolerance;Monitored during session;RN gave pain meds during session    Home Living                      Prior Function            PT Goals (current goals can now be found in the care plan section) Acute Rehab PT Goals PT Goal Formulation: Patient unable to participate in goal setting Time For Goal Achievement: 12/12/14 Potential to Achieve Goals: Fair Progress towards PT goals: Progressing toward goals    Frequency  Min 3X/week    PT Plan Current plan remains appropriate    Co-evaluation             End of Session Equipment Utilized During Treatment: Gait belt Activity Tolerance: Patient tolerated treatment well Patient left: in chair;with call bell/phone within reach;with family/visitor present     Time: [id number] PT Time Calculation (min) (ACUTE ONLY): 15 min  Charges:  $Gait Training: 8-22 mins  G CodesDrucilla Chalet 11/29/2014, 12:34 PM

## 2014-11-29 NOTE — Progress Notes (Signed)
CSW received consult for Advance Directives - spoke with patient's daughter, Rachel Hickman at bedside and explained that due to patient's disorientation that she would not be able to sign but reassured daughter that she is the only emergency contact listed for her and would be the one that we would contact to make medical decisions for her. Daughter states that she understands and plans to take patient home with her at discharge.   No further CSW needs identified - CSW signing off.   Lincoln MaxinKelly Adiel Mcnamara, LCSW [facility] Clinical Social Worker cell #: [phone]

## 2014-11-29 NOTE — Progress Notes (Signed)
Rachel Hickman Hickman:[id number] DOB: [birth year] DOA: 11/27/2014 PCP: No primary care provider on file.  Brief narrative:  81 year old female with history of hypertension, lower extremity swelling,otherwise review bland past medical history admitted from home 11/27/14 after being called by nurse practitioner regarding abnormal labs that were performed on 11/25/14 Patient apparently has been increasingly confused since November which coincides with the time that the patient'Rachel Hickman was transitioned over to Bumex She was admitted with severe renal insufficiency, BUN 152/3.7 Lactic acidosis 1.5 Baseline kidney function is 34/1.3 on 04/01/13  Past medical history-As per Problem list Chart reviewed as below-   Consultants:  none  Procedures:  none  Antibiotics:  none   Subjective   Fair no issues eating and drinking Daughter reports L shoulder pain No n/v    Objective    Interim History:   Telemetry: Sinus bradycardia but improved from admission   Objective: Filed Vitals:   11/28/14 2056 11/29/14 0523 11/29/14 1052 11/29/14 1444  BP: 102/53 103/67 156/129 90/45  Pulse: 67 60  74  Temp: 98 F (36.7 C) 97.9 F (36.6 C)  97.8 F (36.6 C)  TempSrc: Oral Oral  Oral  Resp: Height:      Weight:      SpO2: 99% 100%  100%    Intake/Output Summary (Last 24 hours) at 11/29/14 1703 Last data filed at 11/29/14 0600  Gross per 24 hour  Intake   1365 ml  Output      0 ml  Net   1365 ml    Exam:  General: EOMI NCAT Cardiovascular: S1-S2 no murmur rub or gallop Respiratory: Clinically clear Abdomen: Soft nontender no distention no venous stasis Skin Neuro intact  Data Reviewed: Basic Metabolic Panel:  Recent Labs Lab 11/27/14 1549 11/28/14 0555 11/29/14 1010  NA 132* 137 140  K 3.1* 3.3* 3.5  CL 83* 88* 99  CO2 35* 33* 31  GLUCOSE 185* 104* 119*  BUN 152* 137* 102*  CREATININE 3.71* 2.88* 1.93*  CALCIUM 8.9 8.9 8.9   Liver Function  Tests:  Recent Labs Lab 11/27/14 1549  AST 39*  ALT 19  ALKPHOS 35*  BILITOT 0.9  PROT 6.9  ALBUMIN 3.9   No results for input(Rachel): LIPASE, AMYLASE in the last 168 hours. No results for input(Rachel): AMMONIA in the last 168 hours. CBC:  Recent Labs Lab 11/27/14 1549 11/28/14 0555  WBC 4.0 3.4*  NEUTROABS 2.9  --   HGB 11.4* 11.4*  HCT 34.5* 34.4*  MCV 91.8 91.0  PLT 134* 136*   Cardiac Enzymes: No results for input(Rachel): CKTOTAL, CKMB, CKMBINDEX, TROPONINI in the last 168 hours. BNP: Invalid input(Rachel): POCBNP CBG:  Recent Labs Lab 11/27/14 1608  GLUCAP 162*    No results found for this or any previous visit (from the past 240 hour(Rachel)).   Studies:              All Imaging reviewed and is as per above notation   Scheduled Meds: . antiseptic oral rinse  7 mL Mouth Rinse BID  . aspirin EC  81 mg Oral Daily  . capsicum oleoresin   Topical BID  . feeding supplement (ENSURE COMPLETE)  237 mL Oral BID BM  . heparin  5,000 Units Subcutaneous 3 times per day   Continuous Infusions: . sodium chloride 75 mL/hr at 11/29/14 0731     Assessment/Plan:  1. Aki/ Ck-Glt Equation CKD stg IV-patient will be discontinued forever off of ACE  inhibitor, thiazides, Bumex. Continue gentle saline 75 cc an hour for 1 more day. Creatinine already trending down well. 2. hypotension in setting Hypertension-d/c amlodipine 2.5 mg daily. Clonidine 0.1 on d/c home for PRN pressures above 140/90 3. Hyperkalemia-Kdur 40 daily 4. Moderate to severe dementia-reorient daily. Consider Namenda as an outpatient? 5. L shoulder pain-Add Capsaicin cream  Code Status: Full code Family CommunicationRennie Hickman:  Rachel Hickman,Rachel Hickman Daughter [phone]  Disposition Plan: ? D/c home am 2/9 once Creatinine resolves   Pleas KochJai [name], MD  [facility] Pager [phone] 11/29/2014, 5:03 PM    LOS: 2 days

## 2014-11-30 LAB — BASIC METABOLIC PANEL
ANION GAP: 8 (ref 5–15)
BUN: 81 mg/dL — ABNORMAL HIGH (ref 6–23)
CO2: 30 mmol/L (ref 19–32)
CREATININE: 1.51 mg/dL — AB (ref 0.50–1.10)
Calcium: 8.5 mg/dL (ref 8.4–10.5)
Chloride: 103 mmol/L (ref 96–112)
GFR calc Af Amer: 33 mL/min — ABNORMAL LOW (ref >90)
GFR calc non Af Amer: 28 mL/min — ABNORMAL LOW (ref >90)
GLUCOSE: 103 mg/dL — AB (ref 70–99)
Potassium: 3.5 mmol/L (ref 3.5–5.1)
Sodium: 141 mmol/L (ref 135–145)

## 2014-11-30 MED ORDER — FUROSEMIDE 20 MG PO TABS: 20.0000 mg | ORAL_TABLET | Freq: Every day | ORAL | Status: DC | PRN

## 2014-11-30 MED ORDER — ENSURE COMPLETE PO LIQD: 237.0000 mL | Freq: Two times a day (BID) | ORAL | Status: AC

## 2014-11-30 MED ORDER — CLONIDINE HCL 0.1 MG PO TABS: 0.1000 mg | ORAL_TABLET | Freq: Every day | ORAL | Status: DC | PRN

## 2014-11-30 MED ORDER — CAPSICUM OLEORESIN 0.025 % EX CREA: TOPICAL_CREAM | Freq: Two times a day (BID) | CUTANEOUS | Status: DC

## 2014-11-30 NOTE — Discharge Summary (Signed)
Physician Discharge Summary  Rachel Hickman WUJ:[id number] DOB: 06/07/1917 DOA: 11/27/2014  PCP: No primary care provider on file.  Referred to [facility] Dr. Kirt Boys  Admit date: 11/27/2014 Discharge date: 11/30/2014  Time spent: 40 minutes  Recommendations for Outpatient Follow-up:   1. Needs BMET within 5-6 days 2. Encouraged patient and family to force po fluids  3. Patient will need to take Lasix on a when necessary basis 20 mg if gains over 1 kg in 1 day 4. Patient encouraged to take by mouth clonidine 0.1 mg if blood pressure over 150/90 5. Would not recommend checking blood sugars or starting hypoglycemic agents given her advanced age and lack of mortality benefit 6. Recommend capsaicin ointment to left shoulder-physical therapy to come out of the home and teach her mobility range of motion exercises for adhesive capsulitis left side 7. Recommend MoCa/MMSE to be done as an outpatient-consider Namenda per geriatrician 8. I would not pursue workup of the pineal mass unless family wishes the same 63. Recommend to kindly pursue goals of care as an outpatient with her daughter-her daughter is an LPN and understands these issues clearly   Discharge Diagnoses:  Principal Problem:   Toxic metabolic encephalopathy Active Problems:   Dehydration   Acute kidney injury   Bradycardia   Disorder of pineal gland   Discharge Condition: Stable  Diet recommendation: Liberalize diet-I have encouraged daughter only to cut back slightly on salt  Filed Weights   11/27/14 1930 11/28/14 0625  Weight: 50.576 kg (111 lb 8 oz) 51.075 kg (112 lb 9.6 oz)    History of present illness:   81 year old female with history of hypertension, lower extremity swelling,otherwise review bland past medical history admitted from home 11/27/14 after being called by nurse practitioner regarding abnormal labs that were performed on 11/25/14 Patient apparently has been increasingly confused since November  which coincides with the time that the patient's Lasix was transitioned over to Bumex She was admitted with severe renal insufficiency, BUN 152/3.7 Lactic acidosis 1.5 Baseline kidney function is 34/1.3 on 04/01/13 Patient was admitted to the hospital and it was noted that her memory did not really improve with improvement in her BUN/creatinine. It was thought that she had at least moderate dementia which I would estimate at around 20/30 on an MMSE She was tolerating diet well and daughter was encouraged to force fluids and understands clearly changes to medications inclusive of no diuretics scheduled, no metoprolol scheduled, and no aggressive glycemic control at this stage-I had long discussions with the family on admission and during the hospital stay about the goals of care and social worker was consulted to assist-unfortunately patient does not have capacity to make decisions about who is the next of kin so we could not organize healthcare power of attorney during hospital stay On discharge her kidney function was 80/1.5 and the daughter felt very comfortable taking her home with understanding the issues above We have ordered physical therapy with a rolling walker to see her and we will get occupational therapy also to help with adhesive capsulitis management    Discharge Exam: Filed Vitals:   11/30/14 0612  BP: 126/68  Pulse: 76  Temp: 98.7 F (37.1 C)  Resp: 16    General:  Confused but doesn't Cardiovascular:  S1-S2 and no murmur rub or gallop Respiratory:  Clinically clear  Discharge Instructions    Current Discharge Medication List    START taking these medications   Details  capsicum oleoresin (TRIXAICIN)  0.025 % cream Apply topically 2 (two) times daily. Qty: 56.6 g, Refills: 0    cloNIDine (CATAPRES) 0.1 MG tablet Take 1 tablet (0.1 mg total) by mouth daily as needed (for blood pressure over 160/90). Qty: 60 tablet, Refills: 11    feeding supplement, ENSURE  COMPLETE, (ENSURE COMPLETE) LIQD Take 237 mLs by mouth 2 (two) times daily between meals.    furosemide (LASIX) 20 MG tablet Take 1 tablet (20 mg total) by mouth daily as needed for edema (for lower extremity edema). Qty: 30 tablet, Refills: 0      CONTINUE these medications which have NOT CHANGED   Details  aspirin 81 MG tablet Take 81 mg by mouth daily.      STOP taking these medications     bumetanide (BUMEX) 2 MG tablet      metoprolol succinate (TOPROL-XL) 100 MG 24 hr tablet      valsartan-hydrochlorothiazide (DIOVAN-HCT) 320-25 MG per tablet      nitrofurantoin, macrocrystal-monohydrate, (MACROBID) 100 MG capsule        No Known Allergies Follow-up Information    Follow up with [facility]. Go in 2 weeks.   Why:  get lab work prior to going to this appt.   Contact information:   [address] [place] [phone]       The results of significant diagnostics from this hospitalization (including imaging, microbiology, ancillary and laboratory) are listed below for reference.    Significant Diagnostic Studies: Dg Chest 2 View  11/27/2014   CLINICAL DATA:  Increasing confusion over the past 1-2 months. Acute renal insufficiency. Hypernatremia. Hypokalemia. Current history of hypertension.  EXAM: CHEST  2 VIEW  COMPARISON:  04/01/2013.  FINDINGS: Cardiac silhouette upper normal in size. Thoracic aorta tortuous and atherosclerotic, unchanged. Hilar and mediastinal contours otherwise unremarkable. Lungs clear. Bronchovascular markings normal. Pulmonary vascularity normal. No visible pleural effusions. No pneumothorax. Degenerative changes involving the thoracic spine and exaggeration of the usual thoracic kyphosis.  IMPRESSION: No acute cardiopulmonary disease.   Electronically Signed   By: Hulan Saashomas  Lawrence M.D.   On: 11/27/2014 17:07   Ct Head Wo Contrast  11/27/2014   CLINICAL DATA:  Confusion, altered mental status. History of  hypertension.  EXAM: CT HEAD WITHOUT CONTRAST  TECHNIQUE: Contiguous axial images were obtained from the base of the skull through the vertex without intravenous contrast.  COMPARISON:  None.  FINDINGS: Mild cortical volume loss noted with proportional ventricular prominence. Areas of periventricular white matter hypodensity are most compatible with K[place] vessel ischemic change. No acute hemorrhage or infarct is identified. There is a calcified mass in the region of the pineal gland measuring 1.7 x 1.5 cm.  IMPRESSION: No acute intracranial finding. Calcified 1.7 cm mass in the region of the pineal gland, for which typical differential considerations include meningioma, pineoblastoma, germinoma or other germ-cell tumor, or metastatic disease. Further evaluation at brain MRI with contrast is recommended. These results were called by telephone at the time of interpretation on 11/27/2014 at 5:14 pm to Dr. Shawna OrleansMELANIE BELFI , who verbally acknowledged these results.   Electronically Signed   By: Christiana PellantGretchen  Green M.D.   On: 11/27/2014 17:15    Microbiology: No results found for this or any previous visit (from the past 240 hour(s)).   Labs: Basic Metabolic Panel:  Recent Labs Lab 11/27/14 1549 11/28/14 0555 11/29/14 1010 11/30/14 0715  NA 132* 137 140 141  K 3.1* 3.3* 3.5 3.5  CL 83* 88* 99  103  CO2 35* 33* 31 30  GLUCOSE 185* 104* 119* 103*  BUN 152* 137* 102* 81*  CREATININE 3.71* 2.88* 1.93* 1.51*  CALCIUM 8.9 8.9 8.9 8.5   Liver Function Tests:  Recent Labs Lab 11/27/14 1549  AST 39*  ALT 19  ALKPHOS 35*  BILITOT 0.9  PROT 6.9  ALBUMIN 3.9   No results for input(s): LIPASE, AMYLASE in the last 168 hours. No results for input(s): AMMONIA in the last 168 hours. CBC:  Recent Labs Lab 11/27/14 1549 11/28/14 0555  WBC 4.0 3.4*  NEUTROABS 2.9  --   HGB 11.4* 11.4*  HCT 34.5* 34.4*  MCV 91.8 91.0  PLT 134* 136*   Cardiac Enzymes: No results for input(s): CKTOTAL, CKMB, CKMBINDEX,  TROPONINI in the last 168 hours. BNP: BNP (last 3 results) No results for input(s): BNP in the last 8760 hours.  ProBNP (last 3 results) No results for input(s): PROBNP in the last 8760 hours.  CBG:  Recent Labs Lab 11/27/14 1608  GLUCAP 162*       SignedRhetta Mura  [facility] 11/30/2014, 8:55 AM

## 2014-12-22 ENCOUNTER — Ambulatory Visit (INDEPENDENT_AMBULATORY_CARE_PROVIDER_SITE_OTHER): Payer: Medicare Other | Admitting: Internal Medicine

## 2014-12-22 ENCOUNTER — Encounter: Payer: Self-pay | Admitting: Internal Medicine

## 2014-12-22 VITALS — BP 140/80 | HR 91 | Temp 98.0°F | Resp 20 | Ht 61.81 in | Wt 136.4 lb

## 2014-12-22 DIAGNOSIS — I10 Essential (primary) hypertension: Secondary | ICD-10-CM

## 2014-12-22 DIAGNOSIS — R609 Edema, unspecified: Secondary | ICD-10-CM

## 2014-12-22 DIAGNOSIS — N179 Acute kidney failure, unspecified: Secondary | ICD-10-CM | POA: Diagnosis not present

## 2014-12-22 DIAGNOSIS — I110 Hypertensive heart disease with heart failure: Secondary | ICD-10-CM | POA: Insufficient documentation

## 2014-12-22 DIAGNOSIS — I43 Cardiomyopathy in diseases classified elsewhere: Secondary | ICD-10-CM

## 2014-12-22 MED ORDER — METOPROLOL TARTRATE 25 MG PO TABS: ORAL_TABLET | ORAL | Status: DC

## 2014-12-22 NOTE — Progress Notes (Signed)
Patient ID: Rachel Hickman, female   DOB: 1917-01-10, 81 y.o.   MRN: [id number]    Facility  [facility]    Place of Service:   OFFICE   No Known Allergies  Chief Complaint  Patient presents with  . Establish Care - c/o edema    HPI:  81 yo female seen today as a new pt. She has HTN and takes clonidine prn BP >150/90. Previously she took Diovan hct 320/25 daily and metoprolol er $RemoveBefor'100mg'SAkhazbHVCrp$  daily. For her LE edema, she was taking bumex $RemoveBefo'2mg'jFNDlnHgwgV$  daily which was working well. Lasix ineffective in the past. She has urinary incontinence and wears pull ups. She has a bedside commode. She reports no further issues with left shoulder. Declines HH and home not large enough for rolling walker.   Past Medical History  Diagnosis Date  . Hypertension   . Arthritis   . Confusion   . Acute kidney injury   . Bradycardia   . Disorder of pineal gland    Past Surgical History  Procedure Laterality Date  . Cesarean section     Family History  Problem Relation Age of Onset  . Hypertension    . Cancer Brother   . Cancer Brother   . Cancer Mother   . Diabetes Brother    History   Social History  . Marital Status: Widowed    Spouse Name: N/A  . Number of Children: N/A  . Years of Education: N/A   Social History Main Topics  . Smoking status: Never Smoker   . Smokeless tobacco: Not on file  . Alcohol Use: No  . Drug Use: No  . Sexual Activity: Not on file   Other Topics Concern  . None   Social History Narrative   Diet- Yes, Healthy heart   Caffeine- Yes   Married- 1946, Widowed   House- Yes, moved in with daughter (11/30/14)   Pets- No   Current/past profession-   Exercise- Infrequently   Living will- No   DNR-No   POA/HPOA-No          Past medical, surgical, family and social history reviewed    Medications: Patient's Medications  New Prescriptions   METOPROLOL TARTRATE (LOPRESSOR) 25 MG TABLET    1 tab po daily for blood pressure  Previous Medications   ASPIRIN 81 MG TABLET     Take 81 mg by mouth daily.   CAPSICUM OLEORESIN (TRIXAICIN) 0.025 % CREAM    Apply topically 2 (two) times daily.   CLONIDINE (CATAPRES) 0.1 MG TABLET    Take 1 tablet (0.1 mg total) by mouth daily as needed (for blood pressure over 160/90).   FEEDING SUPPLEMENT, ENSURE COMPLETE, (ENSURE COMPLETE) LIQD    Take 237 mLs by mouth 2 (two) times daily between meals.   FUROSEMIDE (LASIX) 20 MG TABLET    Take 1 tablet (20 mg total) by mouth daily as needed for edema (for lower extremity edema).  Modified Medications   No medications on file  Discontinued Medications   No medications on file     Review of Systems  Constitutional: Positive for unexpected weight change (weight loss). Negative for fever, chills, diaphoresis, activity change, appetite change (actually better since she lives with daughter) and fatigue.  HENT: Positive for dental problem (has dental bridge). Negative for ear pain and sore throat.   Eyes: Positive for visual disturbance (wears glasses).  Respiratory: Negative for cough, chest tightness and shortness of breath.   Cardiovascular: Positive for leg swelling.  Negative for chest pain and palpitations.  Gastrointestinal: Negative for nausea, vomiting, abdominal pain, diarrhea, constipation and blood in stool.  Genitourinary: Positive for urgency. Negative for dysuria.       Urinary incontinence  Musculoskeletal: Positive for arthralgias (with stiffnes).  Neurological: Positive for weakness. Negative for dizziness, tremors, numbness and headaches.  Psychiatric/Behavioral: Positive for confusion. Negative for sleep disturbance. The patient is not nervous/anxious.     Filed Vitals:   12/22/14 1353  BP: 140/80  Pulse: 91  Temp: 98 F (36.7 C)  TempSrc: Oral  Resp: 20  Height: 5' 1.81" (1.57 m)  Weight: 136 lb 6.4 oz (61.871 kg)  SpO2: 96%   Body mass index is 25.1 kg/(m^2).  Physical Exam  Constitutional: She appears well-developed and well-nourished. She does not  appear ill.  HENT:  Mouth/Throat: Oropharynx is clear and moist. No oropharyngeal exudate.  Eyes: Pupils are equal, round, and reactive to light. No scleral icterus.  Neck: Neck supple. No tracheal deviation present.  Cardiovascular: Normal rate, regular rhythm, normal heart sounds and intact distal pulses.  Exam reveals no gallop and no friction rub.   No murmur heard. +2 pitting LE edema b/l. no calf TTP. No carotid bruit b/l  Pulmonary/Chest: Effort normal and breath sounds normal. No stridor. No respiratory distress. She has no wheezes. She has no rales.  Abdominal: Soft. Bowel sounds are normal. She exhibits no distension and no mass. There is no tenderness. There is no rebound and no guarding.  Lymphadenopathy:    She has no cervical adenopathy.  Neurological: She is alert.  Skin: Skin is warm and dry. No rash noted.  Psychiatric: She has a normal mood and affect. Her behavior is normal.     Labs reviewed: Admission on 11/27/2014, Discharged on 11/30/2014  Component Date Value Ref Range Status  . Sodium 11/27/2014 132* 135 - 145 mmol/L Final  . Potassium 11/27/2014 3.1* 3.5 - 5.1 mmol/L Final  . Chloride 11/27/2014 83* 96 - 112 mmol/L Final  . CO2 11/27/2014 35* 19 - 32 mmol/L Final  . Glucose, Bld 11/27/2014 185* 70 - 99 mg/dL Final  . BUN 11/27/2014 152* 6 - 23 mg/dL Final   RESULTS CONFIRMED BY MANUAL DILUTION  . Creatinine, Ser 11/27/2014 3.71* 0.50 - 1.10 mg/dL Final  . Calcium 11/27/2014 8.9  8.4 - 10.5 mg/dL Final  . Total Protein 11/27/2014 6.9  6.0 - 8.3 g/dL Final  . Albumin 11/27/2014 3.9  3.5 - 5.2 g/dL Final  . AST 11/27/2014 39* 0 - 37 U/L Final  . ALT 11/27/2014 19  0 - 35 U/L Final  . Alkaline Phosphatase 11/27/2014 35* 39 - 117 U/L Final  . Total Bilirubin 11/27/2014 0.9  0.3 - 1.2 mg/dL Final  . GFR calc non Af Amer 11/27/2014 10* >90 mL/min Final  . GFR calc Af Amer 11/27/2014 11* >90 mL/min Final   Comment: (NOTE) The eGFR has been calculated using the  CKD EPI equation. This calculation has not been validated in all clinical situations. eGFR's persistently <90 mL/min signify possible Chronic Kidney Disease.   . Anion gap 11/27/2014 14  5 - 15 Final  . Glucose-Capillary 11/27/2014 162* 70 - 99 mg/dL Final  . WBC 11/27/2014 4.0  4.0 - 10.5 K/uL Final  . RBC 11/27/2014 3.76* 3.87 - 5.11 MIL/uL Final  . Hemoglobin 11/27/2014 11.4* 12.0 - 15.0 g/dL Final  . HCT 11/27/2014 34.5* 36.0 - 46.0 % Final  . MCV 11/27/2014 91.8  78.0 - 100.0 fL  Final  . MCH 11/27/2014 30.3  26.0 - 34.0 pg Final  . MCHC 11/27/2014 33.0  30.0 - 36.0 g/dL Final  . RDW 11/27/2014 14.0  11.5 - 15.5 % Final  . Platelets 11/27/2014 134* 150 - 400 K/uL Final  . Neutrophils Relative % 11/27/2014 72  43 - 77 % Final  . Neutro Abs 11/27/2014 2.9  1.7 - 7.7 K/uL Final  . Lymphocytes Relative 11/27/2014 19  12 - 46 % Final  . Lymphs Abs 11/27/2014 0.8  0.7 - 4.0 K/uL Final  . Monocytes Relative 11/27/2014 7  3 - 12 % Final  . Monocytes Absolute 11/27/2014 0.3  0.1 - 1.0 K/uL Final  . Eosinophils Relative 11/27/2014 1  0 - 5 % Final  . Eosinophils Absolute 11/27/2014 0.0  0.0 - 0.7 K/uL Final  . Basophils Relative 11/27/2014 1  0 - 1 % Final  . Basophils Absolute 11/27/2014 0.0  0.0 - 0.1 K/uL Final  . Color, Urine 11/27/2014 YELLOW  YELLOW Final  . APPearance 11/27/2014 CLEAR  CLEAR Final  . Specific Gravity, Urine 11/27/2014 1.011  1.005 - 1.030 Final  . pH 11/27/2014 6.0  5.0 - 8.0 Final  . Glucose, UA 11/27/2014 NEGATIVE  NEGATIVE mg/dL Final  . Hgb urine dipstick 11/27/2014 [place]* NEGATIVE Final  . Bilirubin Urine 11/27/2014 NEGATIVE  NEGATIVE Final  . Ketones, ur 11/27/2014 NEGATIVE  NEGATIVE mg/dL Final  . Protein, ur 11/27/2014 NEGATIVE  NEGATIVE mg/dL Final  . Urobilinogen, UA 11/27/2014 0.2  0.0 - 1.0 mg/dL Final  . Nitrite 11/27/2014 NEGATIVE  NEGATIVE Final  . Leukocytes, UA 11/27/2014 NEGATIVE  NEGATIVE Final  . Lactic Acid, Venous 11/27/2014 1.54  0.5 -  2.0 mmol/L Final  . Squamous Epithelial / LPF 11/27/2014 RARE  RARE Final  . Sodium 11/28/2014 137  135 - 145 mmol/L Final  . Potassium 11/28/2014 3.3* 3.5 - 5.1 mmol/L Final  . Chloride 11/28/2014 88* 96 - 112 mmol/L Final  . CO2 11/28/2014 33* 19 - 32 mmol/L Final  . Glucose, Bld 11/28/2014 104* 70 - 99 mg/dL Final  . BUN 11/28/2014 137* 6 - 23 mg/dL Final   RESULTS CONFIRMED BY MANUAL DILUTION  . Creatinine, Ser 11/28/2014 2.88* 0.50 - 1.10 mg/dL Final  . Calcium 11/28/2014 8.9  8.4 - 10.5 mg/dL Final  . GFR calc non Af Amer 11/28/2014 13* >90 mL/min Final  . GFR calc Af Amer 11/28/2014 15* >90 mL/min Final   Comment: (NOTE) The eGFR has been calculated using the CKD EPI equation. This calculation has not been validated in all clinical situations. eGFR's persistently <90 mL/min signify possible Chronic Kidney Disease.   . Anion gap 11/28/2014 16* 5 - 15 Final  . WBC 11/28/2014 3.4* 4.0 - 10.5 K/uL Final  . RBC 11/28/2014 3.78* 3.87 - 5.11 MIL/uL Final  . Hemoglobin 11/28/2014 11.4* 12.0 - 15.0 g/dL Final  . HCT 11/28/2014 34.4* 36.0 - 46.0 % Final  . MCV 11/28/2014 91.0  78.0 - 100.0 fL Final  . MCH 11/28/2014 30.2  26.0 - 34.0 pg Final  . MCHC 11/28/2014 33.1  30.0 - 36.0 g/dL Final  . RDW 11/28/2014 13.8  11.5 - 15.5 % Final  . Platelets 11/28/2014 136* 150 - 400 K/uL Final  . Sodium 11/29/2014 140  135 - 145 mmol/L Final  . Potassium 11/29/2014 3.5  3.5 - 5.1 mmol/L Final  . Chloride 11/29/2014 99  96 - 112 mmol/L Final   Comment: REPEATED TO VERIFY DELTA CHECK NOTED   .  CO2 11/29/2014 31  19 - 32 mmol/L Final  . Glucose, Bld 11/29/2014 119* 70 - 99 mg/dL Final  . BUN 11/29/2014 102* 6 - 23 mg/dL Final   RESULTS CONFIRMED BY MANUAL DILUTION  . Creatinine, Ser 11/29/2014 1.93* 0.50 - 1.10 mg/dL Final  . Calcium 11/29/2014 8.9  8.4 - 10.5 mg/dL Final  . GFR calc non Af Amer 11/29/2014 21* >90 mL/min Final  . GFR calc Af Amer 11/29/2014 24* >90 mL/min Final   Comment:  (NOTE) The eGFR has been calculated using the CKD EPI equation. This calculation has not been validated in all clinical situations. eGFR's persistently <90 mL/min signify possible Chronic Kidney Disease.   . Anion gap 11/29/2014 10  5 - 15 Final  . Sodium 11/30/2014 141  135 - 145 mmol/L Final  . Potassium 11/30/2014 3.5  3.5 - 5.1 mmol/L Final  . Chloride 11/30/2014 103  96 - 112 mmol/L Final  . CO2 11/30/2014 30  19 - 32 mmol/L Final  . Glucose, Bld 11/30/2014 103* 70 - 99 mg/dL Final  . BUN 11/30/2014 81* 6 - 23 mg/dL Final  . Creatinine, Ser 11/30/2014 1.51* 0.50 - 1.10 mg/dL Final  . Calcium 11/30/2014 8.5  8.4 - 10.5 mg/dL Final  . GFR calc non Af Amer 11/30/2014 28* >90 mL/min Final  . GFR calc Af Amer 11/30/2014 33* >90 mL/min Final   Comment: (NOTE) The eGFR has been calculated using the CKD EPI equation. This calculation has not been validated in all clinical situations. eGFR's persistently <90 mL/min signify possible Chronic Kidney Disease.   Georgiann Hahn gap 11/30/2014 8  5 - 15 Final   Hospital records reviewed. Her d/c BUN/Cr 80/1.5 (previously 152/3.7). BP was stable in the hospital and so diovan hct and BB was not continued at d/c. She was given rx for clonidine to take daily prn BP >150/90  Assessment/Plan    ICD-9-CM ICD-10-CM   1. Essential hypertension, benign - uncontrolled 401.1 I10 metoprolol tartrate (LOPRESSOR) 25 MG tablet     Basic Metabolic Panel  2. Acute kidney injury - was improving at d/c 584.9 W88.8 Basic Metabolic Panel  3. Edema - worse of of  diuretic 782.3 R60.9    b/l LE  4. Bradycardia - resolved 5. Memory loss - possibly senile dementia   --will begin a low dose BB which will control BP and reduce HR (currently 90)  --check BMP to assess renal fxn- if improved and back to baseline, will resume bumex at a lower dose. per pt,  Lasix really has not worked in the past.  --keep legs elevated when seated  --MMSE at CPE in 1 month  --will  call with lab results   [place]. Perlie Gold  [facility] [address] [place], [place] [address] [phone] Office (Wednesdays and Fridays 8 AM - 5 PM) [phone] Cell (Monday-Friday 8 AM - 5 PM)

## 2014-12-23 LAB — BASIC METABOLIC PANEL
BUN/Creatinine Ratio: 20 (ref 11–26)
BUN: 21 mg/dL (ref 10–36)
CO2: 27 mmol/L (ref 18–29)
Calcium: 9 mg/dL (ref 8.7–10.3)
Chloride: 98 mmol/L (ref 97–108)
Creatinine, Ser: 1.06 mg/dL — ABNORMAL HIGH (ref 0.57–1.00)
GFR calc Af Amer: 51 mL/min/{1.73_m2} — ABNORMAL LOW (ref >59)
GFR, EST NON AFRICAN AMERICAN: 44 mL/min/{1.73_m2} — AB (ref >59)
GLUCOSE: 87 mg/dL (ref 65–99)
POTASSIUM: 4.2 mmol/L (ref 3.5–5.2)
Sodium: 139 mmol/L (ref 134–144)

## 2015-02-04 ENCOUNTER — Encounter: Payer: Medicare Other | Admitting: Internal Medicine

## 2015-02-07 ENCOUNTER — Ambulatory Visit (INDEPENDENT_AMBULATORY_CARE_PROVIDER_SITE_OTHER): Payer: Medicare Other | Admitting: Internal Medicine

## 2015-02-07 ENCOUNTER — Encounter: Payer: Self-pay | Admitting: Internal Medicine

## 2015-02-07 VITALS — BP 142/80 | HR 65 | Temp 98.3°F | Wt 142.0 lb

## 2015-02-07 DIAGNOSIS — R609 Edema, unspecified: Secondary | ICD-10-CM | POA: Insufficient documentation

## 2015-02-07 DIAGNOSIS — N179 Acute kidney failure, unspecified: Secondary | ICD-10-CM | POA: Diagnosis not present

## 2015-02-07 DIAGNOSIS — I509 Heart failure, unspecified: Secondary | ICD-10-CM

## 2015-02-07 NOTE — Patient Instructions (Signed)
I recommend an echocardiogram for you if Dr. Montez Moritaarter agrees.   Keep lasix 20mg  daily the same. Please get some compression hose to help with the edema and follow a low sodium diet.

## 2015-02-07 NOTE — Progress Notes (Signed)
Patient ID: Rachel Hickman, female   DOB: 11/29/1916, 81 y.o.   MRN: [id number]   Location:  [facility]   No Known Allergies  Chief Complaint  Patient presents with  . Acute Visit    Swelling in feet and ankles, ongoing concern    HPI: Patient is a 81 y.o.  female seen in the office today for worsening of her swelling of her feet and ankles.  She has gained 5 lbs since 12/22/14. Had been on as needed lasix  which has never been effective--even taking daily, it's been ineffective.  Had been on bumex daily which was effective, but was discontinued at hospital due to dehydration.   Had been bradycardic.   She is eating much better.  Her daughter is managing her meds and living with her.  No known ultrasound of heart.   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  Family has opted against workup.  Review of Systems:  Review of Systems  Constitutional: Negative for fever and chills.       Weight gain  Respiratory: Negative for shortness of breath.   Cardiovascular: Positive for leg swelling. Negative for chest pain, palpitations, orthopnea and PND.  Genitourinary: Positive for urgency and frequency.  Psychiatric/Behavioral: Positive for memory loss.     Past Medical History  Diagnosis Date  . Hypertension   . Arthritis   . Confusion   . Acute kidney injury   . Bradycardia   . Disorder of pineal gland     Past Surgical History  Procedure Laterality Date  . Cesarean section      Social History:   reports that she has never smoked. She does not have any smokeless tobacco history on file. She reports that she does not drink alcohol or use illicit drugs.  Family History  Problem Relation Age of Onset  . Hypertension    . Cancer Brother   . Cancer Brother   . Cancer Mother   . Diabetes Brother     Medications: Patient's Medications  New Prescriptions   No medications on file  Previous Medications   ASPIRIN  81 MG TABLET    Take 81 mg by mouth daily.   CAPSICUM OLEORESIN (TRIXAICIN) 0.025 % CREAM    Apply topically 2 (two) times daily.   CLONIDINE (CATAPRES) 0.1 MG TABLET    Take 1 tablet (0.1 mg total) by mouth daily as needed (for blood pressure over 160/90).   FEEDING SUPPLEMENT, ENSURE COMPLETE, (ENSURE COMPLETE) LIQD    Take 237 mLs by mouth 2 (two) times daily between meals.   FUROSEMIDE (LASIX) 20 MG TABLET    Take 1 tablet (20 mg total) by mouth daily as needed for edema (for lower extremity edema).   METOPROLOL TARTRATE (LOPRESSOR) 25 MG TABLET    1 tab po daily for blood pressure  Modified Medications   No medications on file  Discontinued Medications   No medications on file     Physical Exam: Filed Vitals:   02/07/15 1357  BP: 142/80  Pulse: 65  Temp: 98.3 F (36.8 C)  TempSrc: Oral  Weight: 142 lb (64.411 kg)  SpO2: 98%  Physical Exam  Constitutional: She is oriented to person, place, and time.  Neck: No JVD present.  Cardiovascular: Normal rate, regular rhythm, normal heart sounds and intact distal pulses.   2+ pitting edema bilateral lower legs and feet, left foot slightly larger than right  Pulmonary/Chest: Effort normal and breath sounds normal. No respiratory distress.  Abdominal: Soft. Bowel sounds are normal. She exhibits no distension and no mass. There is no tenderness.  Musculoskeletal: Normal range of motion.  Neurological: She is alert and oriented to person, place, and time.    Labs reviewed: Basic Metabolic Panel:  Recent Labs  40/98/1101/06/06 1010 11/30/14 0715 12/22/14 1511  NA 140 141 139  K 3.5 3.5 4.2  CL 99 103 98  CO2 31 30 27   GLUCOSE 119* 103* 87  BUN 102* 81* 21  CREATININE 1.93* 1.51* 1.06*  CALCIUM 8.9 8.5 9.0   Liver Function Tests:  Recent Labs  11/27/14 1549  AST 39*  ALT 19  ALKPHOS 35*  BILITOT 0.9  PROT 6.9  ALBUMIN 3.9   No results for input(s): LIPASE, AMYLASE in the last 8760 hours. No results for input(s): AMMONIA  in the last 8760 hours. CBC:  Recent Labs  11/27/14 1549 11/28/14 0555  WBC 4.0 3.4*  NEUTROABS 2.9  --   HGB 11.4* 11.4*  HCT 34.5* 34.4*  MCV 91.8 91.0  PLT 134* 136*   Lipid Panel: No results for input(s): CHOL, HDL, LDLCALC, TRIG, CHOLHDL, LDLDIRECT in the last 8760 hours. No results found for: HGBA1C  Past Procedures:  Assessment/Plan 1. Chronic congestive heart failure, unspecified congestive heart failure type - pt's daughter says she has historically been given this diagnosis and was on bumex in addition to her lasix, but got dehydrated on this and several bp meds -advised to continue lasix 20mg  daily only and we'll recheck bmp today - Basic metabolic panel -recommend echo be checked to assess cardiac systolic and diastolic function as well as valves (none in record but just established) -has gained weight, but I suspect this is due to good po intake since living with her daughter   2. Edema -unclear if this is purely cardiac, partially renal and cardiac or cardiac and venous?   -advised to use compression hose and cont lasix 20mg  daily - Basic metabolic panel  3. Acute kidney injury -during hospitalization--had improved dramatically from hospital labs, but has been getting daily lasix (rather than prn as hospital advised) so check bmp - Basic metabolic panel   Labs/tests ordered:   Orders Placed This Encounter  Procedures  . Basic metabolic panel    Next appt:  Schedule annual exam with Dr. Montez Moritaarter with MMSE and echo   Danique Hartsough L. Kanesha Cadle, D.O. Geriatrics [facility] [address] [place], [place] [address] Cell Phone (Mon-Fri 8am-5pm):  [phone] On Call:  [phone] & follow prompts after 5pm & weekends Office Phone:  [phone] Office Fax:  [phone]

## 2015-02-08 LAB — BASIC METABOLIC PANEL
BUN/Creatinine Ratio: 27 — ABNORMAL HIGH (ref 11–26)
BUN: 27 mg/dL (ref 10–36)
CO2: 26 mmol/L (ref 18–29)
Calcium: 9 mg/dL (ref 8.7–10.3)
Chloride: 97 mmol/L (ref 97–108)
Creatinine, Ser: 1.01 mg/dL — ABNORMAL HIGH (ref 0.57–1.00)
GFR calc Af Amer: 54 mL/min/{1.73_m2} — ABNORMAL LOW (ref >59)
GFR calc non Af Amer: 47 mL/min/{1.73_m2} — ABNORMAL LOW (ref >59)
Glucose: 74 mg/dL (ref 65–99)
Potassium: 4 mmol/L (ref 3.5–5.2)
Sodium: 139 mmol/L (ref 134–144)

## 2015-02-17 ENCOUNTER — Telehealth: Payer: Self-pay | Admitting: *Deleted

## 2015-02-17 DIAGNOSIS — R609 Edema, unspecified: Secondary | ICD-10-CM

## 2015-02-17 MED ORDER — FUROSEMIDE 40 MG PO TABS: ORAL_TABLET | ORAL | Status: DC

## 2015-02-17 NOTE — Telephone Encounter (Signed)
Patient daughter Notified and agreed. Placed order. Faxed change in medication to pharmacy

## 2015-02-17 NOTE — Telephone Encounter (Signed)
Rachel HummerCharlene, daughter called and stated that the edema in her feet are no better. Patient taking Lasix 20mg  once daily  but doesn't seem to be helping. Stated that they were just in for this and doesn't want to come back in for an appointment. Please Advise.

## 2015-02-17 NOTE — Telephone Encounter (Signed)
Recommend she f/u with 2D echo US to eval heart function and any wall abnormalities. Increase lasix to 40mg  daily. Purchase TED stockings and apply to LE QAM and remove in PM before bed. Keep legs elevated while seated. F/u as scheduled

## 2015-02-21 ENCOUNTER — Other Ambulatory Visit: Payer: Self-pay | Admitting: Internal Medicine

## 2015-02-22 ENCOUNTER — Other Ambulatory Visit: Payer: Self-pay | Admitting: *Deleted

## 2015-02-22 ENCOUNTER — Other Ambulatory Visit: Payer: Self-pay | Admitting: Internal Medicine

## 2015-02-22 MED ORDER — METOPROLOL TARTRATE 25 MG PO TABS: ORAL_TABLET | ORAL | Status: DC

## 2015-02-22 NOTE — Telephone Encounter (Signed)
Patient caregiver requested and faxed to pharmacy.

## 2015-03-01 ENCOUNTER — Ambulatory Visit (HOSPITAL_COMMUNITY): Payer: Medicare Other | Attending: Internal Medicine

## 2015-03-01 ENCOUNTER — Other Ambulatory Visit: Payer: Self-pay

## 2015-03-01 DIAGNOSIS — I351 Nonrheumatic aortic (valve) insufficiency: Secondary | ICD-10-CM | POA: Insufficient documentation

## 2015-03-01 DIAGNOSIS — I517 Cardiomegaly: Secondary | ICD-10-CM | POA: Diagnosis not present

## 2015-03-01 DIAGNOSIS — I340 Nonrheumatic mitral (valve) insufficiency: Secondary | ICD-10-CM | POA: Insufficient documentation

## 2015-03-01 DIAGNOSIS — R609 Edema, unspecified: Secondary | ICD-10-CM | POA: Diagnosis not present

## 2015-03-01 DIAGNOSIS — I071 Rheumatic tricuspid insufficiency: Secondary | ICD-10-CM | POA: Diagnosis not present

## 2015-03-01 DIAGNOSIS — I10 Essential (primary) hypertension: Secondary | ICD-10-CM | POA: Insufficient documentation

## 2015-03-25 ENCOUNTER — Encounter: Payer: Self-pay | Admitting: Internal Medicine

## 2015-03-25 ENCOUNTER — Ambulatory Visit (INDEPENDENT_AMBULATORY_CARE_PROVIDER_SITE_OTHER): Payer: Medicare Other | Admitting: Internal Medicine

## 2015-03-25 VITALS — BP 140/82 | HR 65 | Temp 97.4°F | Resp 20 | Ht 62.0 in | Wt 140.2 lb

## 2015-03-25 DIAGNOSIS — N189 Chronic kidney disease, unspecified: Secondary | ICD-10-CM

## 2015-03-25 DIAGNOSIS — N183 Chronic kidney disease, stage 3 unspecified: Secondary | ICD-10-CM

## 2015-03-25 DIAGNOSIS — R609 Edema, unspecified: Secondary | ICD-10-CM | POA: Diagnosis not present

## 2015-03-25 DIAGNOSIS — N182 Chronic kidney disease, stage 2 (mild): Secondary | ICD-10-CM | POA: Insufficient documentation

## 2015-03-25 DIAGNOSIS — I10 Essential (primary) hypertension: Secondary | ICD-10-CM | POA: Diagnosis not present

## 2015-03-25 DIAGNOSIS — Z0000 Encounter for general adult medical examination without abnormal findings: Secondary | ICD-10-CM | POA: Diagnosis not present

## 2015-03-25 MED ORDER — BUMETANIDE 1 MG PO TABS: 1.0000 mg | ORAL_TABLET | ORAL | Status: DC

## 2015-03-25 NOTE — Progress Notes (Signed)
Patient ID: Rachel Hickman, female   DOB: 26-Oct-1916, 81 y.o.   MRN: [id number]    Location:    [facility]   Place of Service:  OFFICE    Advanced Directive information  NO POA/LIVING WILL  Chief Complaint  Patient presents with  . Annual Exam    Annual exam    HPI:  81 yo female seen today for annual exam. She is c/a LE swelling and takes lasix  daily. She was previously taking bumex but it was stopped during her last admission as she had AKI and dehydration. We discussed 2D echo results and recommendation of cardiac eval. Daughter and pt declines at this time. She has no other concerns  BP overall is stable on lopressor and lasix  She has generalized arthritis and uses capsacian cream   Past Medical History  Diagnosis Date  . Hypertension   . Arthritis   . Confusion   . Acute kidney injury   . Bradycardia   . Disorder of pineal gland     Past Surgical History  Procedure Laterality Date  . Cesarean section      Patient Care Team: Kirt Boys, DO as PCP - General (Internal Medicine)  History   Social History  . Marital Status: Widowed    Spouse Name: N/A  . Number of Children: N/A  . Years of Education: N/A   Occupational History  . Not on file.   Social History Main Topics  . Smoking status: Never Smoker   . Smokeless tobacco: Not on file  . Alcohol Use: No  . Drug Use: No  . Sexual Activity: Not on file   Other Topics Concern  . Not on file   Social History Narrative   Diet- Yes, Healthy heart   Caffeine- Yes   Married- 1946, Widowed   House- Yes, moved in with daughter (11/30/14)   Pets- No   Current/past profession-   Exercise- Infrequently   Living will- No   DNR-No   POA/HPOA-No           reports that she has never smoked. She does not have any smokeless tobacco history on file. She reports that she does not drink alcohol or use illicit drugs.  Family History  Problem Relation Age of Onset  . Hypertension    . Cancer Brother   .  Cancer Brother   . Cancer Mother   . Diabetes Brother    Family Status  Relation Status Death Age  . Brother Deceased   . Brother Deceased   . Mother Deceased   . Brother Alive   . Father Deceased   . Brother Deceased   . Brother Deceased   . Brother Deceased   . Brother Deceased   . Sister Deceased   . Sister Alive   . Daughter Deceased     MVA  . Daughter Alive     Immunization History  Administered Date(s) Administered  . Influenza,inj,Quad PF,36+ Mos 11/28/2014  . Pneumococcal Polysaccharide-23 11/28/2014    No Known Allergies  Medications: Patient's Medications  New Prescriptions   No medications on file  Previous Medications   ASPIRIN 81 MG TABLET    Take 81 mg by mouth daily.   CAPSICUM OLEORESIN (TRIXAICIN) 0.025 % CREAM    Apply topically 2 (two) times daily.   CLONIDINE (CATAPRES) 0.1 MG TABLET    Take 1 tablet (0.1 mg total) by mouth daily as needed (for blood pressure over 160/90).   FEEDING SUPPLEMENT, ENSURE  COMPLETE, (ENSURE COMPLETE) LIQD    Take 237 mLs by mouth 2 (two) times daily between meals.   FUROSEMIDE (LASIX) 40 MG TABLET    Take one tablet by mouth once daily for edema   METOPROLOL TARTRATE (LOPRESSOR) 25 MG TABLET    Take one tablet by mouth once daily for blood pressure  Modified Medications   No medications on file  Discontinued Medications   No medications on file    Review of Systems  Constitutional: Negative for fever, chills, diaphoresis, activity change, appetite change and fatigue.  HENT: Negative for ear pain and sore throat.   Eyes: Negative for visual disturbance.  Respiratory: Negative for cough, chest tightness and shortness of breath.   Cardiovascular: Positive for leg swelling. Negative for chest pain and palpitations.  Gastrointestinal: Negative for nausea, vomiting, abdominal pain, diarrhea, constipation and blood in stool.  Genitourinary: Negative for dysuria.  Musculoskeletal: Positive for joint swelling, arthralgias  and gait problem.  Neurological: Negative for dizziness, tremors, numbness and headaches.  Psychiatric/Behavioral: Negative for sleep disturbance. The patient is not nervous/anxious.     Filed Vitals:   03/25/15 1517  BP: 140/82  Pulse: 65  Temp: 97.4 F (36.3 C)  TempSrc: Oral  Resp: 20  Height:  (1.575 m)  Weight: 140 lb 3.7 oz (63.608 kg)  SpO2: 98%   Body mass index is 25.64 kg/(m^2).  Physical Exam  Constitutional: She is oriented to person, place, and time. She appears well-developed and well-nourished. No distress.  Frail appearing in NAD. Lying on exam table  HENT:  Head: Normocephalic and atraumatic.  Right Ear: Hearing, tympanic membrane, external ear and ear canal normal.  Left Ear: Hearing, tympanic membrane, external ear and ear canal normal.  Mouth/Throat: Uvula is midline, oropharynx is clear and moist and mucous membranes are normal. She does not have dentures.  Eyes: Conjunctivae, EOM and lids are normal. Pupils are equal, round, and reactive to light. No scleral icterus.  Neck: Trachea normal and normal range of motion. Neck supple. Carotid bruit is not present. No thyroid mass and no thyromegaly present.  Cardiovascular: Normal rate, regular rhythm and intact distal pulses.  Exam reveals no gallop and no friction rub.   Murmur (1/6 SEM) heard. No carotid bruit b/l. 2+ pitting LE edema b/l. No calf TTP.   Pulmonary/Chest: Effort normal and breath sounds normal. She has no wheezes. She has no rhonchi. She has no rales.  Abdominal: Soft. Normal appearance, normal aorta and bowel sounds are normal. She exhibits no pulsatile midline mass and no mass. There is no hepatosplenomegaly. There is no tenderness. There is no rigidity, no rebound and no guarding. No hernia.  Musculoskeletal: She exhibits edema and tenderness.  Lymphadenopathy:       Head (right side): No posterior auricular adenopathy present.       Head (left side): No posterior auricular adenopathy  present.    She has no cervical adenopathy.       Right: No supraclavicular adenopathy present.       Left: No supraclavicular adenopathy present.  Neurological: She is alert and oriented to person, place, and time. She has normal strength. No cranial nerve deficit. Gait normal.  Skin: Skin is warm, dry and intact. No rash noted. Nails show no clubbing.  Psychiatric: She has a normal mood and affect. Her speech is normal and behavior is normal. Thought content normal. Cognition and memory are normal.     Labs reviewed: Office Visit on 02/07/2015  Component  Date Value Ref Range Status  . Glucose 02/07/2015 74  65 - 99 mg/dL Final  . BUN 96/04/540904/18/2016 27  10 - 36 mg/dL Final  . Creatinine, Ser 02/07/2015 1.01* 0.57 - 1.00 mg/dL Final  . GFR calc non Af Amer 02/07/2015 47* >59 mL/min/1.73 Final  . GFR calc Af Amer 02/07/2015 54* >59 mL/min/1.73 Final  . BUN/Creatinine Ratio 02/07/2015 27* 11 - 26 Final  . Sodium 02/07/2015 139  134 - 144 mmol/L Final  . Potassium 02/07/2015 4.0  3.5 - 5.2 mmol/L Final  . Chloride 02/07/2015 97  97 - 108 mmol/L Final  . CO2 02/07/2015 26  18 - 29 mmol/L Final  . Calcium 02/07/2015 9.0  8.7 - 10.3 mg/dL Final    No results found.   Assessment/Plan   ICD-9-CM ICD-10-CM   1. Well adult exam V70.0 Z00.00   2. Edema - unchanged 782.3 R60.9 Basic Metabolic Panel  3. Essential hypertension, benign - stable 401.1 I10   4. Chronic renal insufficiency, stage III (moderate) 585.3 N18.9 Basic Metabolic Panel    --declined cardio eval. Will try bumex 3 times weekly. Check BMP in 2 weeks to follow renal fxn  --Pt is UTD on health maintenance. Vaccinations are UTD. Pt maintains a healthy lifestyle. Encouraged pt to exercise 30-45 minutes 4-5 times per week. Eat a well balanced diet. Avoid smoking. Limit alcohol intake. Wear seatbelt when riding in the car. Wear sun block (SPF >50) when spending extended times outside.  --f/u in 3 mos for routine visit  Bennet Kujawa S.  Ancil Linseyarter, D. O., F. A. C. O. I.  [facility] [address] [place], [place] [address] [phone] Cell (Monday-Friday 8 AM - 5 PM) [phone] After 5 PM and follow prompts

## 2015-03-25 NOTE — Patient Instructions (Signed)
Do not take lasix on the days you take bumex dose  Continue other medications as ordered  Get lab in 2 weeks  Follow up in office in 3 mos for routine visit  Encouraged pt to exercise as tolerated 30-45 minutes 4-5 times per week. Eat a well balanced diet. Avoid smoking. Limit alcohol intake. Wear seatbelt when riding in the car. Wear sun block (SPF >50) when spending extended times outside.

## 2015-04-11 ENCOUNTER — Other Ambulatory Visit: Payer: Medicare Other

## 2015-04-11 DIAGNOSIS — N189 Chronic kidney disease, unspecified: Secondary | ICD-10-CM | POA: Diagnosis not present

## 2015-04-11 DIAGNOSIS — N183 Chronic kidney disease, stage 3 unspecified: Secondary | ICD-10-CM

## 2015-04-11 DIAGNOSIS — R609 Edema, unspecified: Secondary | ICD-10-CM

## 2015-04-12 LAB — BASIC METABOLIC PANEL
BUN/Creatinine Ratio: 20 (ref 11–26)
BUN: 21 mg/dL (ref 10–36)
CALCIUM: 9.1 mg/dL (ref 8.7–10.3)
CO2: 28 mmol/L (ref 18–29)
CREATININE: 1.06 mg/dL — AB (ref 0.57–1.00)
Chloride: 98 mmol/L (ref 97–108)
GFR, EST AFRICAN AMERICAN: 51 mL/min/{1.73_m2} — AB (ref >59)
GFR, EST NON AFRICAN AMERICAN: 44 mL/min/{1.73_m2} — AB (ref >59)
GLUCOSE: 90 mg/dL (ref 65–99)
Potassium: 3.6 mmol/L (ref 3.5–5.2)
SODIUM: 143 mmol/L (ref 134–144)

## 2015-04-27 ENCOUNTER — Telehealth: Payer: Self-pay | Admitting: Internal Medicine

## 2015-04-27 ENCOUNTER — Other Ambulatory Visit: Payer: Self-pay | Admitting: Internal Medicine

## 2015-04-27 DIAGNOSIS — I509 Heart failure, unspecified: Secondary | ICD-10-CM

## 2015-04-27 DIAGNOSIS — I10 Essential (primary) hypertension: Secondary | ICD-10-CM

## 2015-04-27 DIAGNOSIS — R609 Edema, unspecified: Secondary | ICD-10-CM

## 2015-04-27 NOTE — Telephone Encounter (Signed)
Patients daughter called this morning and stated that she had received a call from us previously wanting to refer patient to cardiologist, after an Echo was done 03/01/15 she stated she declined the referral suggestion at that time but now after giving it some thought she would now like her mother to see a cardiologist. She states the patient does not currently have a cardiologist.   Please enter referral in the system     (Explained referral process to daughter, she verbally understood. Patient does not have a cardiologist. Once referral is placed we will submit referral and then Cardiologist's office will contact daughter to schedule the appointment.)

## 2015-05-03 NOTE — Telephone Encounter (Signed)
Referral order was placed by Dr.Carter on 04/28/15

## 2015-06-21 ENCOUNTER — Other Ambulatory Visit: Payer: Self-pay | Admitting: *Deleted

## 2015-06-21 MED ORDER — METOPROLOL TARTRATE 25 MG PO TABS: ORAL_TABLET | ORAL | Status: DC

## 2015-06-21 NOTE — Telephone Encounter (Signed)
[facility] [address] 

## 2015-06-23 ENCOUNTER — Inpatient Hospital Stay (HOSPITAL_COMMUNITY)
Admission: EM | Admit: 2015-06-23 | Discharge: 2015-06-24 | DRG: 195 | Disposition: A | Discharge status: Home / Self Care | Payer: Medicare Other | Attending: Internal Medicine | Admitting: Internal Medicine

## 2015-06-23 ENCOUNTER — Emergency Department (HOSPITAL_COMMUNITY): Payer: Medicare Other

## 2015-06-23 ENCOUNTER — Encounter (HOSPITAL_COMMUNITY): Payer: Self-pay

## 2015-06-23 DIAGNOSIS — I10 Essential (primary) hypertension: Secondary | ICD-10-CM

## 2015-06-23 DIAGNOSIS — N183 Chronic kidney disease, stage 3 (moderate): Secondary | ICD-10-CM | POA: Diagnosis not present

## 2015-06-23 DIAGNOSIS — D649 Anemia, unspecified: Secondary | ICD-10-CM | POA: Diagnosis not present

## 2015-06-23 DIAGNOSIS — Z79899 Other long term (current) drug therapy: Secondary | ICD-10-CM | POA: Diagnosis not present

## 2015-06-23 DIAGNOSIS — Z833 Family history of diabetes mellitus: Secondary | ICD-10-CM

## 2015-06-23 DIAGNOSIS — Z8249 Family history of ischemic heart disease and other diseases of the circulatory system: Secondary | ICD-10-CM | POA: Diagnosis not present

## 2015-06-23 DIAGNOSIS — T501X5A Adverse effect of loop [high-ceiling] diuretics, initial encounter: Secondary | ICD-10-CM | POA: Diagnosis present

## 2015-06-23 DIAGNOSIS — R0602 Shortness of breath: Secondary | ICD-10-CM | POA: Diagnosis not present

## 2015-06-23 DIAGNOSIS — J181 Lobar pneumonia, unspecified organism: Secondary | ICD-10-CM

## 2015-06-23 DIAGNOSIS — I129 Hypertensive chronic kidney disease with stage 1 through stage 4 chronic kidney disease, or unspecified chronic kidney disease: Secondary | ICD-10-CM | POA: Diagnosis not present

## 2015-06-23 DIAGNOSIS — J9601 Acute respiratory failure with hypoxia: Secondary | ICD-10-CM | POA: Diagnosis not present

## 2015-06-23 DIAGNOSIS — E876 Hypokalemia: Secondary | ICD-10-CM | POA: Diagnosis not present

## 2015-06-23 DIAGNOSIS — Z7982 Long term (current) use of aspirin: Secondary | ICD-10-CM

## 2015-06-23 DIAGNOSIS — J189 Pneumonia, unspecified organism: Principal | ICD-10-CM | POA: Diagnosis present

## 2015-06-23 DIAGNOSIS — I509 Heart failure, unspecified: Secondary | ICD-10-CM | POA: Diagnosis not present

## 2015-06-23 DIAGNOSIS — R05 Cough: Secondary | ICD-10-CM | POA: Diagnosis not present

## 2015-06-23 LAB — MAGNESIUM: Magnesium: 1.7 mg/dL (ref 1.7–2.4)

## 2015-06-23 LAB — COMPREHENSIVE METABOLIC PANEL
ALBUMIN: 2.8 g/dL — AB (ref 3.5–5.0)
ALK PHOS: 63 U/L (ref 38–126)
ALT: 17 U/L (ref 14–54)
ALT: 17 U/L (ref 14–54)
ANION GAP: 10 (ref 5–15)
AST: 25 U/L (ref 15–41)
AST: 24 U/L (ref 15–41)
Albumin: 3 g/dL — ABNORMAL LOW (ref 3.5–5.0)
Alkaline Phosphatase: 67 U/L (ref 38–126)
Anion gap: 8 (ref 5–15)
BUN: 22 mg/dL — AB (ref 6–20)
BUN: 19 mg/dL (ref 6–20)
CALCIUM: 8.6 mg/dL — AB (ref 8.9–10.3)
CALCIUM: 8.5 mg/dL — AB (ref 8.9–10.3)
CHLORIDE: 100 mmol/L — AB (ref 101–111)
CHLORIDE: 99 mmol/L — AB (ref 101–111)
CO2: 31 mmol/L (ref 22–32)
CO2: 32 mmol/L (ref 22–32)
CREATININE: 0.82 mg/dL (ref 0.44–1.00)
Creatinine, Ser: 0.91 mg/dL (ref 0.44–1.00)
GFR calc non Af Amer: 52 mL/min — ABNORMAL LOW (ref >60)
GFR, EST AFRICAN AMERICAN: 60 mL/min — AB (ref >60)
GFR, EST NON AFRICAN AMERICAN: 59 mL/min — AB (ref >60)
GLUCOSE: 126 mg/dL — AB (ref 65–99)
Glucose, Bld: 106 mg/dL — ABNORMAL HIGH (ref 65–99)
Potassium: 3.2 mmol/L — ABNORMAL LOW (ref 3.5–5.1)
Potassium: 3.2 mmol/L — ABNORMAL LOW (ref 3.5–5.1)
SODIUM: 141 mmol/L (ref 135–145)
Sodium: 139 mmol/L (ref 135–145)
Total Bilirubin: 0.5 mg/dL (ref 0.3–1.2)
Total Bilirubin: 0.5 mg/dL (ref 0.3–1.2)
Total Protein: 6.9 g/dL (ref 6.5–8.1)
Total Protein: 6.7 g/dL (ref 6.5–8.1)

## 2015-06-23 LAB — CBC WITH DIFFERENTIAL/PLATELET
BASOS ABS: 0 10*3/uL (ref 0.0–0.1)
BASOS ABS: 0 10*3/uL (ref 0.0–0.1)
Basophils Relative: 0 % (ref 0–1)
Basophils Relative: 0 % (ref 0–1)
EOS PCT: 2 % (ref 0–5)
Eosinophils Absolute: 0.1 10*3/uL (ref 0.0–0.7)
Eosinophils Absolute: 0.3 10*3/uL (ref 0.0–0.7)
Eosinophils Relative: 5 % (ref 0–5)
HEMATOCRIT: 28.4 % — AB (ref 36.0–46.0)
HEMATOCRIT: 29.1 % — AB (ref 36.0–46.0)
HEMOGLOBIN: 9 g/dL — AB (ref 12.0–15.0)
HEMOGLOBIN: 9.1 g/dL — AB (ref 12.0–15.0)
LYMPHS ABS: 0.7 10*3/uL (ref 0.7–4.0)
LYMPHS ABS: 0.7 10*3/uL (ref 0.7–4.0)
LYMPHS PCT: 9 % — AB (ref 12–46)
LYMPHS PCT: 10 % — AB (ref 12–46)
MCH: 28.2 pg (ref 26.0–34.0)
MCH: 27.7 pg (ref 26.0–34.0)
MCHC: 31.7 g/dL (ref 30.0–36.0)
MCHC: 31.3 g/dL (ref 30.0–36.0)
MCV: 89 fL (ref 78.0–100.0)
MCV: 88.7 fL (ref 78.0–100.0)
Monocytes Absolute: 0.9 10*3/uL (ref 0.1–1.0)
Monocytes Absolute: 0.7 10*3/uL (ref 0.1–1.0)
Monocytes Relative: 11 % (ref 3–12)
Monocytes Relative: 10 % (ref 3–12)
NEUTROS ABS: 6 10*3/uL (ref 1.7–7.7)
NEUTROS ABS: 5 10*3/uL (ref 1.7–7.7)
NEUTROS PCT: 78 % — AB (ref 43–77)
Neutrophils Relative %: 75 % (ref 43–77)
PLATELETS: 288 10*3/uL (ref 150–400)
Platelets: 269 10*3/uL (ref 150–400)
RBC: 3.19 MIL/uL — AB (ref 3.87–5.11)
RBC: 3.28 MIL/uL — AB (ref 3.87–5.11)
RDW: 15.8 % — ABNORMAL HIGH (ref 11.5–15.5)
RDW: 15.5 % (ref 11.5–15.5)
WBC: 7.8 10*3/uL (ref 4.0–10.5)
WBC: 6.6 10*3/uL (ref 4.0–10.5)

## 2015-06-23 LAB — I-STAT TROPONIN, ED: TROPONIN I, POC: 0.04 ng/mL (ref 0.00–0.08)

## 2015-06-23 LAB — BRAIN NATRIURETIC PEPTIDE: B Natriuretic Peptide: 356.3 pg/mL — ABNORMAL HIGH (ref 0.0–100.0)

## 2015-06-23 LAB — TSH: TSH: 1.44 u[IU]/mL (ref 0.350–4.500)

## 2015-06-23 LAB — POC OCCULT BLOOD, ED: FECAL OCCULT BLD: NEGATIVE

## 2015-06-23 LAB — PROTIME-INR
INR: 1.1 (ref 0.00–1.49)
Prothrombin Time: 14.4 seconds (ref 11.6–15.2)

## 2015-06-23 LAB — PHOSPHORUS: PHOSPHORUS: 2.6 mg/dL (ref 2.5–4.6)

## 2015-06-23 LAB — APTT: aPTT: 28 seconds (ref 24–37)

## 2015-06-23 LAB — I-STAT CG4 LACTIC ACID, ED: Lactic Acid, Venous: 0.82 mmol/L (ref 0.5–2.0)

## 2015-06-23 MED ORDER — POTASSIUM CHLORIDE 20 MEQ/15ML (10%) PO SOLN
20.0000 meq | Freq: Once | ORAL | Status: AC
  Administered 2015-06-23: 20 meq via ORAL
  Filled 2015-06-23: qty 15

## 2015-06-23 MED ORDER — FUROSEMIDE 40 MG PO TABS
40.0000 mg | ORAL_TABLET | Freq: Every day | ORAL | Status: DC
  Administered 2015-06-23 – 2015-06-24 (×2): 40 mg via ORAL
  Filled 2015-06-23 (×2): qty 1

## 2015-06-23 MED ORDER — METOPROLOL TARTRATE 25 MG PO TABS
25.0000 mg | ORAL_TABLET | Freq: Every day | ORAL | Status: DC
  Administered 2015-06-23 – 2015-06-24 (×2): 25 mg via ORAL
  Filled 2015-06-23 (×2): qty 1

## 2015-06-23 MED ORDER — DEXTROSE 5 % IV SOLN
500.0000 mg | Freq: Once | INTRAVENOUS | Status: AC
  Administered 2015-06-23: 500 mg via INTRAVENOUS
  Filled 2015-06-23: qty 500

## 2015-06-23 MED ORDER — ASPIRIN 81 MG PO CHEW
81.0000 mg | CHEWABLE_TABLET | Freq: Every day | ORAL | Status: DC
  Administered 2015-06-23 – 2015-06-24 (×2): 81 mg via ORAL
  Filled 2015-06-23 (×2): qty 1

## 2015-06-23 MED ORDER — DEXTROSE 5 % IV SOLN
500.0000 mg | INTRAVENOUS | Status: DC
  Administered 2015-06-24: 500 mg via INTRAVENOUS
  Filled 2015-06-23 (×2): qty 500

## 2015-06-23 MED ORDER — ONDANSETRON HCL 4 MG/2ML IJ SOLN: 4.0000 mg | Freq: Four times a day (QID) | INTRAMUSCULAR | Status: DC | PRN

## 2015-06-23 MED ORDER — SODIUM CHLORIDE 0.9 % IV SOLN
INTRAVENOUS | Status: AC
  Administered 2015-06-23: 14:00:00 via INTRAVENOUS

## 2015-06-23 MED ORDER — DEXTROSE 5 % IV SOLN
1.0000 g | INTRAVENOUS | Status: DC
  Administered 2015-06-24: 1 g via INTRAVENOUS
  Filled 2015-06-23: qty 10

## 2015-06-23 MED ORDER — DEXTROSE 5 % IV SOLN
1.0000 g | Freq: Once | INTRAVENOUS | Status: AC
  Administered 2015-06-23: 1 g via INTRAVENOUS
  Filled 2015-06-23: qty 10

## 2015-06-23 MED ORDER — CLONIDINE HCL 0.1 MG PO TABS: 0.1000 mg | ORAL_TABLET | Freq: Every day | ORAL | Status: DC | PRN

## 2015-06-23 MED ORDER — FUROSEMIDE 10 MG/ML IJ SOLN
40.0000 mg | Freq: Once | INTRAMUSCULAR | Status: AC
  Administered 2015-06-23: 40 mg via INTRAVENOUS
  Filled 2015-06-23: qty 4

## 2015-06-23 MED ORDER — ACETAMINOPHEN 650 MG RE SUPP: 650.0000 mg | Freq: Four times a day (QID) | RECTAL | Status: DC | PRN

## 2015-06-23 MED ORDER — ONDANSETRON HCL 4 MG PO TABS: 4.0000 mg | ORAL_TABLET | Freq: Four times a day (QID) | ORAL | Status: DC | PRN

## 2015-06-23 MED ORDER — ACETAMINOPHEN 325 MG PO TABS: 650.0000 mg | ORAL_TABLET | Freq: Four times a day (QID) | ORAL | Status: DC | PRN

## 2015-06-23 NOTE — ED Notes (Signed)
Pov from home, c/o sob and pain in right side pain with cough in the am.

## 2015-06-23 NOTE — ED Provider Notes (Signed)
CSN: [id number]     Arrival date & time 06/23/15  1015 History   First MD Initiated Contact with Patient 06/23/15 1018     Chief Complaint  Patient presents with  . Shortness of Breath     (Consider location/radiation/quality/duration/timing/severity/associated sxs/prior Treatment) HPI   I 51 showed female with a history today with new onset of cough, dyspnea, and subjective fever at home today. Majority of history is obtained from her daughter with whom she lives. Daughter states that she was in her usual state of health last night when she went to bed. Today she had coughing with some whitish sputum. The daughter felt that she has rhonchi at the base of her lungs. Felt that she was warm.She gave her a nsaid and felt she cooled and felt better.  She has chronic lower extremity edema which has been worse over the past month. It is not lateralized. Past Medical History  Diagnosis Date  . Hypertension   . Arthritis   . Confusion   . Acute kidney injury   . Bradycardia   . Disorder of pineal gland    Past Surgical History  Procedure Laterality Date  . Cesarean section     Family History  Problem Relation Age of Onset  . Hypertension    . Cancer Brother   . Cancer Brother   . Cancer Mother   . Diabetes Brother    Social History  Substance Use Topics  . Smoking status: Never Smoker   . Smokeless tobacco: None  . Alcohol Use: No   OB History    No data available     Review of Systems  All other systems reviewed and are negative.     Allergies  Review of patient's allergies indicates no known allergies.  Home Medications   Prior to Admission medications   Medication Sig Start Date End Date Taking? Authorizing Provider  aspirin 81 MG tablet Take 81 mg by mouth daily.   Yes Historical Provider, MD  furosemide (LASIX) 40 MG tablet Take one tablet by mouth once daily for edema Patient taking differently: Take 40 mg by mouth daily.  02/17/15  Yes Kirt Boys, DO   metoprolol tartrate (LOPRESSOR) 25 MG tablet Take one tablet by mouth once daily for blood pressure Patient taking differently: Take 25 mg by mouth daily.  06/21/15  Yes Kirt Boys, DO  bumetanide (BUMEX) 1 MG tablet Take 1 tablet (1 mg total) by mouth 3 (three) times a week. Patient not taking: Reported on 06/23/2015 03/25/15   Kirt Boys, DO  capsicum oleoresin (TRIXAICIN) 0.025 % cream Apply topically 2 (two) times daily. Patient not taking: Reported on 06/23/2015 11/30/14   Rhetta Mura, MD  cloNIDine (CATAPRES) 0.1 MG tablet Take 1 tablet (0.1 mg total) by mouth daily as needed (for blood pressure over 160/90). Patient not taking: Reported on 06/23/2015 11/30/14   Rhetta Mura, MD  feeding supplement, ENSURE COMPLETE, (ENSURE COMPLETE) LIQD Take 237 mLs by mouth 2 (two) times daily between meals. Patient not taking: Reported on 06/23/2015 11/30/14   Rhetta Mura, MD   BP 168/90 mmHg  Pulse 56  Temp(Src) 98.4 F (36.9 C) (Rectal)  Resp 16  Wt 140 lb (63.504 kg)  SpO2 94% Physical Exam  Constitutional: She appears well-developed and well-nourished.  HENT:  Head: Normocephalic and atraumatic.  Right Ear: External ear normal.  Left Ear: External ear normal.  Nose: Nose normal.  Mouth/Throat: Oropharynx is clear and moist.  Eyes: Pupils are equal, round,  and reactive to light.  Neck: Normal range of motion. Neck supple.  Cardiovascular: A regularly irregular rhythm present.  Pulmonary/Chest: Effort normal and breath sounds normal. No respiratory distress. She has no wheezes. She has no rales. She exhibits tenderness.  Abdominal: Soft. Bowel sounds are normal. She exhibits no distension.  Musculoskeletal: Normal range of motion. She exhibits edema.  Neurological: She is alert. No cranial nerve deficit. She exhibits normal muscle tone. Coordination normal.  Skin: Skin is warm and dry. No rash noted. No erythema. No pallor.  Psychiatric: She has a normal mood and affect.   Nursing note and vitals reviewed.   ED Course  Procedures (including critical care time) Labs Review Labs Reviewed  CBC WITH DIFFERENTIAL/PLATELET - Abnormal; Notable for the following:    RBC 3.19 (*)    Hemoglobin 9.0 (*)    HCT 28.4 (*)    RDW 15.8 (*)    Neutrophils Relative % 78 (*)    Lymphocytes Relative 9 (*)    All other components within normal limits  COMPREHENSIVE METABOLIC PANEL - Abnormal; Notable for the following:    Potassium 3.2 (*)    Chloride 100 (*)    Glucose, Bld 106 (*)    BUN 22 (*)    Calcium 8.6 (*)    Albumin 3.0 (*)    GFR calc non Af Amer 59 (*)    All other components within normal limits  BRAIN NATRIURETIC PEPTIDE - Abnormal; Notable for the following:    B Natriuretic Peptide 356.3 (*)    All other components within normal limits  I-STAT TROPOININ, ED  I-STAT CG4 LACTIC ACID, ED    Imaging Review Dg Chest 1 View  06/23/2015   CLINICAL DATA:  Shortness of breath and cough for 1 day  EXAM: CHEST  1 VIEW  COMPARISON:  November 27, 2014  FINDINGS: There is consolidation in the left lower lobe. There is mild generalized interstitial edema. Heart is upper normal in size with pulmonary vascularity within normal limits. No adenopathy. There is atherosclerotic change in aorta.  IMPRESSION: Mild generalized interstitial edema. There may be a degree of underlying congestive heart failure. Airspace consolidation in the left lower lobe appears more consistent with pneumonia than edema.  Followup PA and lateral radiographs recommended in 3-4 weeks following trial of antibiotic therapy to ensure resolution and exclude underlying malignancy.   Electronically Signed   By: Bretta Bang III M.D.   On: 06/23/2015 10:59   I have personally reviewed and evaluated these images and lab results as part of my medical decision-making.   EKG Interpretation   Date/Time:  Thursday June 23 2015 10:34:58 EDT Ventricular Rate:  83 PR Interval:  138 QRS Duration:  79 QT Interval:  387 QTC Calculation: 455 R Axis:   47 Text Interpretation:  Normal sinus rhythm with sinus arrhythmia and pvcs  Confirmed by Aiana Nordquist MD, Duwayne Heck ([id number]) on 06/23/2015 11:49:19 AM      MDM   Final diagnoses:  CAP (community acquired pneumonia)  Anemia, unspecified anemia type  Acute on chronic congestive heart failure, unspecified congestive heart failure type  Hypokalemia    72 y,o female presents today with cough and workup. Reveals a left lower lobe consolidation consistent with community-acquired pneumonia. She has been given Rocephin and Zithromax here. She also appears to have some fluid overload with generalized interstitial edema and BNP elevated at 357. She is given 40 mg of Lasix. She was not febrile here although there is some report  that she had a subjective fever at home. She had blood cultures and lactic acid obtained which were normal. EKG reveals a trigeminal rhythm she has a normal troponin. Hemoglobin is noted to be decreased to 9 from a previous 11. There is no report of GI bleeding. She was given a nonsteroidal prior to coming into the ED. She will need this trended and is also having a heme occult done. Potassium is low at 3.2 and we will proceed with oral repletion.  1- cap- lll infiltrate treated with rocephin and zithromax.  Lactic acid, bp, temp normal. 2- chf- patient with interstitial edema and elevated bnp- lasix iv given 3- hypertension- chronic- patient did not take am meds 4- anemia- decreased hgb from 11 to 9, no reports of bleeding.   Discussed with Dr. Elisabeth Pigeon and will admit to MedSurg bed.  Margarita Grizzle, MD 06/23/15 1229

## 2015-06-23 NOTE — ED Notes (Signed)
Pt voided per diaper pt cleaned

## 2015-06-23 NOTE — ED Notes (Signed)
Pt family requesting foley cath r/t to lasix.  Explain rational of infection prevention. Family member states "They say that, I still want her to have one." Dr Rosalia Hammers informed and denied.

## 2015-06-23 NOTE — H&P (Signed)
[facility] History and Physical  Rachel Hickman ZOX:[id number] DOB: 06-27-1917 DOA: 06/23/2015  Referring physician: ER physician: Dr. Margarita Grizzle PCP: Kirt Boys, DO  Chief Complaint: cough, shortness of breath   HPI:  81 year old femal with past medical history of hypertension who presented to [facility] ED with reports of cough, subjective fevers and weakness for past 24 hours prior to this admission. Patient reported cough productive of whitish sputum. No chest pain. She had some shortness of breath with exertion but not ar rest. No abdominal pain, nausea or vomiting. No falls. No blood in stool or urine. No reports of diarrhea. No urinary symptoms.   In ED, pt was hemodynamically stable. Her blood work was notable for hemoglobin of 9, potassium of 3.2, normal creatinine. CXR showed possible underlying CHF although left lower lobe pneumonia more likely. She was started on azithromycin and rocephin and admitted for further management of probable pneumonia.    Assessment & Plan    Principal Problem: Acute respiratory failure with hypoxia / Lobar pneumonia, left lower lobe - Mildly hypoxic, saturating 93% with  oxygen support - Stable respiratory status - Left lower pneumonia seen on CXR - Started on azithromycin and rocephin - Pneumonia order set placed - Follow up blood and resp cultures - Follow up legionella and strep pneumonia results  Active Problems: Hypokalemia - Due to lasix - Supplemented  - Essential hypertension - Resumed lasix and clonidine     DVT prophylaxis:  - SCD's bilaterally   Radiological Exams on Admission: Dg Chest 1 View 06/23/2015  Mild generalized interstitial edema. There may be a degree of underlying congestive heart failure. Airspace consolidation in the left lower lobe appears more consistent with pneumonia than edema.  Followup PA and lateral radiographs recommended in 3-4 weeks following trial of antibiotic therapy to ensure resolution and  exclude underlying malignancy.     Code Status: Full Family Communication: Plan of care discussed with the patient and her sisters at the bedside  Disposition Plan: Admit for further evaluation  Manson Passey, MD  [facility] Pager [phone]  Time spent in minutes: 75 minutes  Review of Systems:  Constitutional: Negative for fever, chills and malaise/fatigue. Negative for diaphoresis.  HENT: Negative for hearing loss, ear pain, nosebleeds, congestion, sore throat, neck pain, tinnitus and ear discharge.   Eyes: Negative for blurred vision, double vision, photophobia, pain, discharge and redness.  Respiratory: per HPI Cardiovascular: Negative for chest pain, palpitations, orthopnea, claudication and leg swelling.  Gastrointestinal: Negative for nausea, vomiting and abdominal pain. Negative for heartburn, constipation, blood in stool and melena.  Genitourinary: Negative for dysuria, urgency, frequency, hematuria and flank pain.  Musculoskeletal: Negative for myalgias, back pain, joint pain and falls.  Skin: Negative for itching and rash.  Neurological: Negative for dizziness and weakness. Negative for tingling, tremors, sensory change, speech change, focal weakness, loss of consciousness and headaches.  Endo/Heme/Allergies: Negative for environmental allergies and polydipsia. Does not bruise/bleed easily.  Psychiatric/Behavioral: Negative for suicidal ideas. The patient is not nervous/anxious.      Past Medical History  Diagnosis Date  . Hypertension   . Arthritis   . Confusion   . Acute kidney injury   . Bradycardia   . Disorder of pineal gland    Past Surgical History  Procedure Laterality Date  . Cesarean section     Social History:  reports that she has never smoked. She does not have any smokeless tobacco history on file. She reports that she does  not drink alcohol or use illicit drugs.  No Known Allergies  Family History:  Family History  Problem Relation Age of  Onset  . Hypertension    . Cancer Brother   . Cancer Brother   . Cancer Mother   . Diabetes Brother      Prior to Admission medications   Medication Sig Start Date End Date Taking? Authorizing Provider  aspirin 81 MG tablet Take 81 mg by mouth daily.   Yes Historical Provider, MD  furosemide (LASIX) 40 MG tablet Take one tablet by mouth once daily for edema Patient taking differently: Take 40 mg by mouth daily.  02/17/15  Yes Kirt Boys, DO  metoprolol tartrate (LOPRESSOR) 25 MG tablet Take one tablet by mouth once daily for blood pressure Patient taking differently: Take 25 mg by mouth daily.  06/21/15  Yes Kirt Boys, DO  bumetanide (BUMEX) 1 MG tablet Take 1 tablet (1 mg total) by mouth 3 (three) times a week. Patient not taking: Reported on 06/23/2015 03/25/15   Kirt Boys, DO  capsicum oleoresin (TRIXAICIN) 0.025 % cream Apply topically 2 (two) times daily. Patient not taking: Reported on 06/23/2015 11/30/14   Rhetta Mura, MD  cloNIDine (CATAPRES) 0.1 MG tablet Take 1 tablet (0.1 mg total) by mouth daily as needed (for blood pressure over 160/90). Patient not taking: Reported on 06/23/2015 11/30/14   Rhetta Mura, MD  feeding supplement, ENSURE COMPLETE, (ENSURE COMPLETE) LIQD Take 237 mLs by mouth 2 (two) times daily between meals. Patient not taking: Reported on 06/23/2015 11/30/14   Rhetta Mura, MD   Physical Exam: Filed Vitals:   06/23/15 1030 06/23/15 1100 06/23/15 1117 06/23/15 1200  BP: 176/102 168/90  179/88  Pulse: 97 56  47  Temp: 98.1 F (36.7 C)  98.4 F (36.9 C)   TempSrc: Oral  Rectal   Resp: Weight: 63.504 kg (140 lb)     SpO2: 95% 94%  93%    Physical Exam  Constitutional: Appears well-developed and well-nourished. No distress.  HENT: Normocephalic. No tonsillar erythema or exudates Eyes: Conjunctivae are normal. No scleral icterus.  Neck: Normal ROM. Neck supple. No JVD. No tracheal deviation. No thyromegaly.  CVS: rate  controlled, S1/S2 appreciated .  Pulmonary: Effort and breath sounds normal, no stridor, rhonchi, wheezes, rales.  Abdominal: Soft. BS +,  no distension, tenderness, rebound or guarding.  Musculoskeletal: Normal range of motion. +2 LE pitting edema Lymphadenopathy: No lymphadenopathy noted, cervical, inguinal. Neuro: Alert. Normal reflexes, muscle tone coordination. No focal neurologic deficits. Skin: Skin is warm and dry. No rash noted.  No erythema. No pallor.  Psychiatric: Normal mood and affect. Behavior, judgment, thought content normal.   Labs on Admission:  Basic Metabolic Panel:  Recent Labs Lab 06/23/15 1041  NA 139  K 3.2*  CL 100*  CO2 31  GLUCOSE 106*  BUN 22*  CREATININE 0.82  CALCIUM 8.6*   Liver Function Tests:  Recent Labs Lab 06/23/15 1041  AST 25  ALT 17  ALKPHOS 67  BILITOT 0.5  PROT 6.9  ALBUMIN 3.0*   No results for input(s): LIPASE, AMYLASE in the last 168 hours. No results for input(s): AMMONIA in the last 168 hours. CBC:  Recent Labs Lab 06/23/15 1041  WBC 7.8  NEUTROABS 6.0  HGB 9.0*  HCT 28.4*  MCV 89.0  PLT 288   Cardiac Enzymes: No results for input(s): CKTOTAL, CKMB, CKMBINDEX, TROPONINI in the last 168 hours. BNP: Invalid input(s): POCBNP CBG:  No results for input(s): GLUCAP in the last 168 hours.  If 7PM-7AM, please contact night-coverage [email or web address] Password [facility] 06/23/2015, 12:34 PM

## 2015-06-24 LAB — CBC
HEMATOCRIT: 26.8 % — AB (ref 36.0–46.0)
HEMOGLOBIN: 8.5 g/dL — AB (ref 12.0–15.0)
MCH: 28.1 pg (ref 26.0–34.0)
MCHC: 31.7 g/dL (ref 30.0–36.0)
MCV: 88.7 fL (ref 78.0–100.0)
Platelets: 258 10*3/uL (ref 150–400)
RBC: 3.02 MIL/uL — ABNORMAL LOW (ref 3.87–5.11)
RDW: 15.7 % — AB (ref 11.5–15.5)
WBC: 6.2 10*3/uL (ref 4.0–10.5)

## 2015-06-24 LAB — COMPREHENSIVE METABOLIC PANEL
ALBUMIN: 2.6 g/dL — AB (ref 3.5–5.0)
ALT: 17 U/L (ref 14–54)
ANION GAP: 10 (ref 5–15)
AST: 23 U/L (ref 15–41)
Alkaline Phosphatase: 62 U/L (ref 38–126)
BILIRUBIN TOTAL: 0.3 mg/dL (ref 0.3–1.2)
BUN: 25 mg/dL — AB (ref 6–20)
CHLORIDE: 99 mmol/L — AB (ref 101–111)
CO2: 32 mmol/L (ref 22–32)
Calcium: 8.4 mg/dL — ABNORMAL LOW (ref 8.9–10.3)
Creatinine, Ser: 1.06 mg/dL — ABNORMAL HIGH (ref 0.44–1.00)
GFR calc Af Amer: 50 mL/min — ABNORMAL LOW (ref >60)
GFR calc non Af Amer: 43 mL/min — ABNORMAL LOW (ref >60)
GLUCOSE: 101 mg/dL — AB (ref 65–99)
POTASSIUM: 3.4 mmol/L — AB (ref 3.5–5.1)
SODIUM: 141 mmol/L (ref 135–145)
TOTAL PROTEIN: 6.3 g/dL — AB (ref 6.5–8.1)

## 2015-06-24 LAB — STREP PNEUMONIAE URINARY ANTIGEN: STREP PNEUMO URINARY ANTIGEN: NEGATIVE

## 2015-06-24 LAB — GLUCOSE, CAPILLARY: Glucose-Capillary: 88 mg/dL (ref 65–99)

## 2015-06-24 MED ORDER — POTASSIUM CHLORIDE 20 MEQ/15ML (10%) PO SOLN
40.0000 meq | Freq: Once | ORAL | Status: AC
  Administered 2015-06-24: 40 meq via ORAL
  Filled 2015-06-24: qty 30

## 2015-06-24 MED ORDER — LEVOFLOXACIN 750 MG PO TABS: 750.0000 mg | ORAL_TABLET | ORAL | Status: DC

## 2015-06-24 MED ORDER — POTASSIUM CHLORIDE ER 10 MEQ PO TBCR: 10.0000 meq | EXTENDED_RELEASE_TABLET | Freq: Every day | ORAL | Status: DC

## 2015-06-24 MED ORDER — CLONIDINE HCL 0.1 MG PO TABS: 0.1000 mg | ORAL_TABLET | Freq: Every day | ORAL | Status: DC

## 2015-06-24 NOTE — Discharge Instructions (Signed)
Pneumonia °Pneumonia is an infection of the lungs.  °CAUSES °Pneumonia may be caused by bacteria or a virus. Usually, these infections are caused by breathing infectious particles into the lungs (respiratory tract). °SIGNS AND SYMPTOMS  °· Cough. °· Fever. °· Chest pain. °· Increased rate of breathing. °· Wheezing. °· Mucus production. °DIAGNOSIS  °If you have the common symptoms of pneumonia, your health care provider will typically confirm the diagnosis with a chest X-ray. The X-ray will show an abnormality in the lung (pulmonary infiltrate) if you have pneumonia. Other tests of your blood, urine, or sputum may be done to find the specific cause of your pneumonia. Your health care provider may also do tests (blood gases or pulse oximetry) to see how well your lungs are working. °TREATMENT  °Some forms of pneumonia may be spread to other people when you cough or sneeze. You may be asked to wear a mask before and during your exam. Pneumonia that is caused by bacteria is treated with antibiotic medicine. Pneumonia that is caused by the influenza virus may be treated with an antiviral medicine. Most other viral infections must run their course. These infections will not respond to antibiotics.  °HOME CARE INSTRUCTIONS  °· Cough suppressants may be used if you are losing too much rest. However, coughing protects you by clearing your lungs. You should avoid using cough suppressants if you can. °· Your health care provider may have prescribed medicine if he or she thinks your pneumonia is caused by bacteria or influenza. Finish your medicine even if you start to feel better. °· Your health care provider may also prescribe an expectorant. This loosens the mucus to be coughed up. °· Take medicines only as directed by your health care provider. °· Do not smoke. Smoking is a common cause of bronchitis and can contribute to pneumonia. If you are a smoker and continue to smoke, your cough may last several weeks after your  pneumonia has cleared. °· A cold steam vaporizer or humidifier in your room or home may help loosen mucus. °· Coughing is often worse at night. Sleeping in a semi-upright position in a recliner or using a couple pillows under your head will help with this. °· Get rest as you feel it is needed. Your body will usually let you know when you need to rest. °PREVENTION °A pneumococcal shot (vaccine) is available to prevent a common bacterial cause of pneumonia. This is usually suggested for: °· People over 65 years old. °· Patients on chemotherapy. °· People with chronic lung problems, such as bronchitis or emphysema. °· People with immune system problems. °If you are over 65 or have a high risk condition, you may receive the pneumococcal vaccine if you have not received it before. In some countries, a routine influenza vaccine is also recommended. This vaccine can help prevent some cases of pneumonia. You may be offered the influenza vaccine as part of your care. °If you smoke, it is time to quit. You may receive instructions on how to stop smoking. Your health care provider can provide medicines and counseling to help you quit. °SEEK MEDICAL CARE IF: °You have a fever. °SEEK IMMEDIATE MEDICAL CARE IF:  °· Your illness becomes worse. This is especially true if you are elderly or weakened from any other disease. °· You cannot control your cough with suppressants and are losing sleep. °· You begin coughing up blood. °· You develop pain which is getting worse or is uncontrolled with medicines. °· Any of the symptoms   which initially brought you in for treatment are getting worse rather than better.  You develop shortness of breath or chest pain. MAKE SURE YOU:   Understand these instructions.  Will watch your condition.  Will get help right away if you are not doing well or get worse. Document Released: 10/08/2005 Document Revised: 02/22/2014 Document Reviewed: 12/28/2010 [facility] Patient Information 2015  [place], [place]. This information is not intended to replace advice given to you by your health care provider. Make sure you discuss any questions you have with your health care provider. Levofloxacin tablets What is this medicine? LEVOFLOXACIN (lee voe FLOX a sin) is a quinolone antibiotic. It is used to treat certain kinds of bacterial infections. It will not work for colds, flu, or other viral infections. This medicine may be used for other purposes; ask your health care provider or pharmacist if you have questions. COMMON BRAND NAME(S): Levaquin, Levaquin Leva-Pak What should I tell my health care provider before I take this medicine? They need to know if you have any of these conditions: -cerebral disease -irregular heartbeat -kidney disease -seizure disorder -an unusual or allergic reaction to levofloxacin, other antibiotics or medicines, foods, dyes, or preservatives -pregnant or trying to get pregnant -breast-feeding How should I use this medicine? Take this medicine by mouth with a full glass of water. Follow the directions on the prescription label. This medicine can be taken with or without food. Take your medicine at regular intervals. Do not take your medicine more often than directed. Do not skip doses or stop your medicine early even if you feel better. Do not stop taking except on your doctor's advice. A special MedGuide will be given to you by the pharmacist with each prescription and refill. Be sure to read this information carefully each time. Talk to your pediatrician regarding the use of this medicine in children. While this drug may be prescribed for children as young as 6 months for selected conditions, precautions do apply. Overdosage: If you think you have taken too much of this medicine contact a poison control center or emergency room at once. NOTE: This medicine is only for you. Do not share this medicine with others. What if I miss a dose? If you miss a dose, take it  as soon as you remember. If it is almost time for your next dose, take only that dose. Do not take double or extra doses. What may interact with this medicine? Do not take this medicine with any of the following medications: -arsenic trioxide -chloroquine -droperidol -medicines for irregular heart rhythm like amiodarone, disopyramide, dofetilide, flecainide, quinidine, procainamide, sotalol -some medicines for depression or mental problems like phenothiazines, pimozide, and ziprasidone This medicine may also interact with the following medications: -amoxapine -antacids -birth control pills -cisapride -dairy products -didanosine (ddI) buffered tablets or powder -haloperidol -multivitamins -NSAIDS, medicines for pain and inflammation, like ibuprofen or naproxen -retinoid products like tretinoin or isotretinoin -risperidone -some other antibiotics like clarithromycin or erythromycin -sucralfate -theophylline -warfarin This list may not describe all possible interactions. Give your health care provider a list of all the medicines, herbs, non-prescription drugs, or dietary supplements you use. Also tell them if you smoke, drink alcohol, or use illegal drugs. Some items may interact with your medicine. What should I watch for while using this medicine? Tell your doctor or health care professional if your symptoms do not improve or if they get worse. Drink several glasses of water a day and cut down on drinks that contain caffeine. You  must not get dehydrated while taking this medicine. You may get drowsy or dizzy. Do not drive, use machinery, or do anything that needs mental alertness until you know how this medicine affects you. Do not sit or stand up quickly, especially if you are an older patient. This reduces the risk of dizzy or fainting spells. This medicine can make you more sensitive to the sun. Keep out of the sun. If you cannot avoid being in the sun, wear protective clothing and use  a sunscreen. Do not use sun lamps or tanning beds/booths. Contact your doctor if you get a sunburn. If you are a diabetic monitor your blood glucose carefully. If you get an unusual reading stop taking this medicine and call your doctor right away. Do not treat diarrhea with over-the-counter products. Contact your doctor if you have diarrhea that lasts more than 2 days or if the diarrhea is severe and watery. Avoid antacids, calcium, iron, and zinc products for 2 hours before and 2 hours after taking a dose of this medicine. What side effects may I notice from receiving this medicine? Side effects that you should report to your doctor or health care professional as soon as possible: -allergic reactions like skin rash or hives, swelling of the face, lips, or tongue -changes in vision -confusion, nightmares or hallucinations -difficulty breathing -irregular heartbeat, chest pain -joint, muscle or tendon pain -pain or difficulty passing urine -persistent headache with or without blurred vision -redness, blistering, peeling or loosening of the skin, including inside the mouth -seizures -unusual pain, numbness, tingling, or weakness -vaginal irritation, discharge Side effects that usually do not require medical attention (report to your doctor or health care professional if they continue or are bothersome): -diarrhea -dry mouth -headache -stomach upset, nausea -trouble sleeping This list may not describe all possible side effects. Call your doctor for medical advice about side effects. You may report side effects to FDA at 1-800-FDA-1088. Where should I keep my medicine? Keep out of the reach of children. Store at room temperature between 15 and 30 degrees C (59 and 86 degrees F). Keep in a tightly closed container. Throw away any unused medicine after the expiration date. NOTE: This sheet is a summary. It may not cover all possible information. If you have questions about this medicine, talk  to your doctor, pharmacist, or health care provider.  2015, [employer] Standard. (2013-05-15 07:45:07) Clonidine tablets What is this medicine? CLONIDINE (KLOE ni deen) is used to treat high blood pressure. This medicine may be used for other purposes; ask your health care provider or pharmacist if you have questions. COMMON BRAND NAME(S): Catapres What should I tell my health care provider before I take this medicine? They need to know if you have any of these conditions: -kidney disease -an unusual or allergic reaction to clonidine, other medicines, foods, dyes, or preservatives -pregnant or trying to get pregnant -breast-feeding How should I use this medicine? Take this medicine by mouth with a glass of water. Follow the directions on the prescription label. Take your doses at regular intervals. Do not take your medicine more often than directed. Do not suddenly stop taking this medicine. You must gradually reduce the dose or you may get a dangerous increase in blood pressure. Ask your doctor or health care professional for advice. Talk to your pediatrician regarding the use of this medicine in children. Special care may be needed. Overdosage: If you think you have taken too much of this medicine contact a poison control center  or emergency room at once. NOTE: This medicine is only for you. Do not share this medicine with others. What if I miss a dose? If you miss a dose, take it as soon as you can. If it is almost time for your next dose, take only that dose. Do not take double or extra doses. What may interact with this medicine? Do not take this medicine with any of the following medications: -MAOIs like Carbex, Eldepryl, Marplan, Nardil, and Parnate This medicine may also interact with the following medications: -barbiturate medicines for inducing sleep or treating seizures like phenobarbital -certain medicines for blood pressure, heart disease, irregular heart beat -certain  medicines for depression, anxiety, or psychotic disturbances -prescription pain medicines This list may not describe all possible interactions. Give your health care provider a list of all the medicines, herbs, non-prescription drugs, or dietary supplements you use. Also tell them if you smoke, drink alcohol, or use illegal drugs. Some items may interact with your medicine. What should I watch for while using this medicine? Visit your doctor or health care professional for regular checks on your progress. Check your heart rate and blood pressure regularly while you are taking this medicine. Ask your doctor or health care professional what your heart rate should be and when you should contact him or her. You may get drowsy or dizzy. Do not drive, use machinery, or do anything that needs mental alertness until you know how this medicine affects you. To avoid dizzy or fainting spells, do not stand or sit up quickly, especially if you are an older person. Alcohol can make you more drowsy and dizzy. Avoid alcoholic drinks. Your mouth may get dry. Chewing sugarless gum or sucking hard candy, and drinking plenty of water will help. Do not treat yourself for coughs, colds, or pain while you are taking this medicine without asking your doctor or health care professional for advice. Some ingredients may increase your blood pressure. If you are going to have surgery tell your doctor or health care professional that you are taking this medicine. What side effects may I notice from receiving this medicine? Side effects that you should report to your doctor or health care professional as soon as possible: -allergic reactions like skin rash, itching or hives, swelling of the face, lips, or tongue -anxiety, nervousness -chest pain -depression -fast, irregular heartbeat -swelling of feet or legs -unusually weak or tired Side effects that usually do not require medical attention (report to your doctor or health care  professional if they continue or are bothersome): -change in sex drive or performance -constipation -headache This list may not describe all possible side effects. Call your doctor for medical advice about side effects. You may report side effects to FDA at 1-800-FDA-1088. Where should I keep my medicine? Keep out of the reach of children. Store at room temperature between 15 and 30 degrees C (59 and 86 degrees F). Protect from light. Keep container tightly closed. Throw away any unused medicine after the expiration date. NOTE: This sheet is a summary. It may not cover all possible information. If you have questions about this medicine, talk to your doctor, pharmacist, or health care provider.  2015, [employer] Standard. (2011-04-04 13:01:28)

## 2015-06-24 NOTE — Discharge Summary (Addendum)
Physician Discharge Summary  Rachel Hickman ZOX:[id number] DOB: 08-15-1917 DOA: 06/23/2015  PCP: Kirt Boys, DO  Admit date: 06/23/2015 Discharge date: 06/24/2015  Recommendations for Outpatient Follow-up:  1. Continue Lasix once a day as per prior home regimen. Please note we added potassium supplementation once a day because Lasix can cause lower potassium level. 2. Continue metoprolol for blood pressure control. Continue clonidine once a day. Please do not take clonidine on as-needed basis as it may result in rebound hypertension. Clonidine has to be taken on a scheduled basis. 3. Continue Levaquin for 5 doses on discharge for pneumonia.  Discharge Diagnoses:  Active Problems:   CAP (community acquired pneumonia)   PNA (pneumonia)    Discharge Condition: stable   Diet recommendation: as tolerated   History of present illness:  81 year old femal with past medical history of hypertension who presented to [facility] ED with reports of cough productive of whitish sputum , subjective fevers and weakness for past 24 hours prior to this admission.    In ED, pt was hemodynamically stable. Her blood work was notable for hemoglobin of 9, potassium of 3.2, normal creatinine. CXR showed possible underlying CHF although left lower lobe pneumonia more likely. She was started on azithromycin and rocephin and admitted for further management of probable pneumonia.    Hospital Course:    Assessment & Plan    Principal Problem: Acute respiratory failure with hypoxia / Lobar pneumonia, left lower lobe - Patient is saturating 94-97% with 2 L nasal cannula oxygen support. Respiratory status is stable. She does not need oxygen on discharge. - Chest x-ray demonstrated most likely left lower lobe pneumonia - She was on azithromycin and Rocephin in hospital. She will continue Levaquin every other day for 5 doses on discharge - Strep pneumonia is negative - Legionella, blood cultures and respiratory  cultures are pending at this time. Will follow-up on outpatient basis.  Active Problems: Hypokalemia - Due to lasix - Supplemented, she will continue daily potassium supplementation on discharge  - Essential hypertension - Resumed lasix and clonidine     Chronic kidney disease, stage III - Baseline creatinine is 1.3. Creatinine during this hospital stay is around baseline values.    DVT prophylaxis:  - SCD's bilaterally   Signed:  Manson Passey, MD  [facility] 06/24/2015, 9:46 AM  Pager #: [phone]  Time spent in minutes: more than 30 minutes   Discharge Exam: Filed Vitals:   06/24/15 0557  BP: 186/89  Pulse: 88  Temp: 98.5 F (36.9 C)  Resp: 18   Filed Vitals:   06/23/15 1421 06/23/15 2018 06/24/15 0036 06/24/15 0557  BP: 175/88 157/67  186/89  Pulse: 86 66  88  Temp: 97.6 F (36.4 C) 98.5 F (36.9 C)  98.5 F (36.9 C)  TempSrc: Oral Oral  Oral  Resp: 17 18  18   Height: 5\' 3"  (1.6 m)     Weight: 65.046 kg (143 lb 6.4 oz)  68.04 kg (150 lb)   SpO2: 96% 97%  94%    General: Pt is alert, follows commands appropriately, not in acute distress Cardiovascular: Regular rate and rhythm, S1/S2 + Respiratory: Clear to auscultation bilaterally, no wheezing, no crackles, no rhonchi Abdominal: Soft, non tender, non distended, bowel sounds +, no guarding Extremities: +2 LE pitting edema, no cyanosis, pulses palpable bilaterally DP and PT Neuro: Grossly nonfocal  Discharge Instructions  Discharge Instructions    Call MD for:  difficulty breathing, headache or visual disturbances  Complete by:  As directed      Call MD for:  persistant nausea and vomiting    Complete by:  As directed      Call MD for:  severe uncontrolled pain    Complete by:  As directed      Diet - low sodium heart healthy    Complete by:  As directed      Discharge instructions    Complete by:  As directed   1. Continue Lasix once a day as per prior home regimen. Please note we  added potassium supplementation once a day because Lasix can cause lower potassium level. 2. Continue metoprolol for blood pressure control. Continue clonidine once a day. Please do not take clonidine on as-needed basis as it may result in rebound hypertension. Clonidine has to be taken on a scheduled basis. 3. Continue Levaquin for 5 doses on discharge for pneumonia.     Increase activity slowly    Complete by:  As directed             Medication List    STOP taking these medications        bumetanide 1 MG tablet  Commonly known as:  BUMEX     capsicum oleoresin 0.025 % cream  Commonly known as:  TRIXAICIN      TAKE these medications        aspirin 81 MG tablet  Take 81 mg by mouth daily.     cloNIDine 0.1 MG tablet  Commonly known as:  CATAPRES  Take 1 tablet (0.1 mg total) by mouth daily.     feeding supplement (ENSURE COMPLETE) Liqd  Take 237 mLs by mouth 2 (two) times daily between meals.     furosemide 40 MG tablet  Commonly known as:  LASIX  Take one tablet by mouth once daily for edema     levofloxacin 750 MG tablet  Commonly known as:  LEVAQUIN  Take 1 tablet (750 mg total) by mouth every other day.     metoprolol tartrate 25 MG tablet  Commonly known as:  LOPRESSOR  Take one tablet by mouth once daily for blood pressure     potassium chloride 10 MEQ tablet  Commonly known as:  K-DUR  Take 1 tablet (10 mEq total) by mouth daily.           Follow-up Information    Follow up with Kirt Boys, DO On 06/24/2015.   Specialty:  Internal Medicine   Why:  Follow up appt after recent hospitalization; appointment already scheduled   Contact information:   [address] [place] [address] [phone]        The results of significant diagnostics from this hospitalization (including imaging, microbiology, ancillary and laboratory) are listed below for reference.    Significant Diagnostic Studies: Dg Chest 1 View  06/23/2015   CLINICAL DATA:   Shortness of breath and cough for 1 day  EXAM: CHEST  1 VIEW  COMPARISON:  November 27, 2014  FINDINGS: There is consolidation in the left lower lobe. There is mild generalized interstitial edema. Heart is upper normal in size with pulmonary vascularity within normal limits. No adenopathy. There is atherosclerotic change in aorta.  IMPRESSION: Mild generalized interstitial edema. There may be a degree of underlying congestive heart failure. Airspace consolidation in the left lower lobe appears more consistent with pneumonia than edema.  Followup PA and lateral radiographs recommended in 3-4 weeks following trial of antibiotic therapy to ensure resolution  and exclude underlying malignancy.   Electronically Signed   By: Bretta Bang III M.D.   On: 06/23/2015 10:59    Microbiology: No results found for this or any previous visit (from the past 240 hour(s)).   Labs: Basic Metabolic Panel:  Recent Labs Lab 06/23/15 1041 06/23/15 1630 06/24/15 0450  NA 139 141 141  K 3.2* 3.2* 3.4*  CL 100* 99* 99*  CO2 31 32 32  GLUCOSE 106* 126* 101*  BUN 22* 19 25*  CREATININE 0.82 0.91 1.06*  CALCIUM 8.6* 8.5* 8.4*  MG  --  1.7  --   PHOS  --  2.6  --    Liver Function Tests:  Recent Labs Lab 06/23/15 1041 06/23/15 1630 06/24/15 0450  AST ALT ALKPHOS 67 63 62  BILITOT 0.5 0.5 0.3  PROT 6.9 6.7 6.3*  ALBUMIN 3.0* 2.8* 2.6*   No results for input(s): LIPASE, AMYLASE in the last 168 hours. No results for input(s): AMMONIA in the last 168 hours. CBC:  Recent Labs Lab 06/23/15 1041 06/23/15 1630 06/24/15 0450  WBC 7.8 6.6 6.2  NEUTROABS 6.0 5.0  --   HGB 9.0* 9.1* 8.5*  HCT 28.4* 29.1* 26.8*  MCV 89.0 88.7 88.7  PLT 288 269 258   Cardiac Enzymes: No results for input(s): CKTOTAL, CKMB, CKMBINDEX, TROPONINI in the last 168 hours. BNP: BNP (last 3 results)  Recent Labs  06/23/15 1041  BNP 356.3*    ProBNP (last 3 results) No results for input(s):  PROBNP in the last 8760 hours.  CBG:  Recent Labs Lab 06/24/15 0754  GLUCAP 88

## 2015-06-27 ENCOUNTER — Other Ambulatory Visit: Payer: Self-pay | Admitting: Internal Medicine

## 2015-06-27 LAB — LEGIONELLA ANTIGEN, URINE

## 2015-06-28 LAB — CULTURE, BLOOD (ROUTINE X 2)
CULTURE: NO GROWTH
Culture: NO GROWTH

## 2015-06-29 ENCOUNTER — Ambulatory Visit: Payer: Medicare Other | Admitting: Internal Medicine

## 2015-07-07 ENCOUNTER — Ambulatory Visit: Payer: Medicare Other | Admitting: Cardiology

## 2015-07-15 ENCOUNTER — Encounter: Payer: Self-pay | Admitting: Internal Medicine

## 2015-07-15 ENCOUNTER — Ambulatory Visit
Admission: RE | Admit: 2015-07-15 | Discharge: 2015-07-15 | Disposition: A | Discharge status: Home / Self Care | Payer: Medicare Other | Source: Ambulatory Visit | Attending: Internal Medicine | Admitting: Internal Medicine

## 2015-07-15 ENCOUNTER — Ambulatory Visit (INDEPENDENT_AMBULATORY_CARE_PROVIDER_SITE_OTHER): Payer: Medicare Other | Admitting: Internal Medicine

## 2015-07-15 ENCOUNTER — Other Ambulatory Visit: Payer: Self-pay

## 2015-07-15 VITALS — BP 130/80 | HR 72 | Temp 98.7°F | Resp 18 | Ht 63.0 in | Wt 143.0 lb

## 2015-07-15 DIAGNOSIS — S0083XA Contusion of other part of head, initial encounter: Secondary | ICD-10-CM

## 2015-07-15 DIAGNOSIS — M79601 Pain in right arm: Secondary | ICD-10-CM | POA: Diagnosis not present

## 2015-07-15 DIAGNOSIS — R5381 Other malaise: Secondary | ICD-10-CM

## 2015-07-15 DIAGNOSIS — W19XXXA Unspecified fall, initial encounter: Secondary | ICD-10-CM | POA: Diagnosis not present

## 2015-07-15 DIAGNOSIS — I509 Heart failure, unspecified: Secondary | ICD-10-CM | POA: Diagnosis not present

## 2015-07-15 DIAGNOSIS — S59911A Unspecified injury of right forearm, initial encounter: Secondary | ICD-10-CM | POA: Diagnosis not present

## 2015-07-15 DIAGNOSIS — N183 Chronic kidney disease, stage 3 unspecified: Secondary | ICD-10-CM

## 2015-07-15 DIAGNOSIS — Z23 Encounter for immunization: Secondary | ICD-10-CM

## 2015-07-15 DIAGNOSIS — M25431 Effusion, right wrist: Secondary | ICD-10-CM

## 2015-07-15 DIAGNOSIS — N189 Chronic kidney disease, unspecified: Secondary | ICD-10-CM | POA: Diagnosis not present

## 2015-07-15 DIAGNOSIS — M25531 Pain in right wrist: Secondary | ICD-10-CM

## 2015-07-15 DIAGNOSIS — R413 Other amnesia: Secondary | ICD-10-CM

## 2015-07-15 DIAGNOSIS — S63501A Unspecified sprain of right wrist, initial encounter: Secondary | ICD-10-CM

## 2015-07-15 DIAGNOSIS — I10 Essential (primary) hypertension: Secondary | ICD-10-CM

## 2015-07-15 DIAGNOSIS — R609 Edema, unspecified: Secondary | ICD-10-CM | POA: Diagnosis not present

## 2015-07-15 MED ORDER — DONEPEZIL HCL 5 MG PO TABS: 5.0000 mg | ORAL_TABLET | Freq: Every day | ORAL | Status: DC

## 2015-07-15 NOTE — Progress Notes (Signed)
Patient ID: Rachel Hickman, female   DOB: [birth year], 81 y.o.   MRN: [id number]    Location:    [facility]   Place of Service:  OFFICE   Chief Complaint  Patient presents with  . Medical Management of Chronic Issues    swelling in her right hand and bilateral lower legs and feet  . Hospitalization Follow-up    HPI:  81 yo female seen today for f/u. She spent 1 day in the hospital for LLL CAP. She was treated with IV abx and transitioned to po levaquin x 5 days. She completed abx. No home O2 needed at d/c. She has fallen several times over the last several weeks, most recent last weekend. She now has RUE swelling and pain since most recent fall and bruise along OD. Edema has slightly worsened since d/c. She continues to take daily lasix and scheduled clonidine  Past Medical History  Diagnosis Date  . Hypertension   . Arthritis   . Confusion   . Acute kidney injury   . Bradycardia   . Disorder of pineal gland     Past Surgical History  Procedure Laterality Date  . Cesarean section      Patient Care Team: Gildardo Cranker, DO as PCP - General (Internal Medicine)  Social History   Social History  . Marital Status: Widowed    Spouse Name: N/A  . Number of Children: N/A  . Years of Education: N/A   Occupational History  . Not on file.   Social History Main Topics  . Smoking status: Never Smoker   . Smokeless tobacco: Not on file  . Alcohol Use: No  . Drug Use: No  . Sexual Activity: Not on file   Other Topics Concern  . Not on file   Social History Narrative   Diet- Yes, Healthy heart   Caffeine- Yes   Married- 1946, Widowed   House- Yes, moved in with daughter (11/30/14)   Pets- No   Current/past profession-   Exercise- Infrequently   Living will- No   DNR-No   POA/HPOA-No           reports that she has never smoked. She does not have any smokeless tobacco history on file. She reports that she does not drink alcohol or use illicit drugs.  No Known  Allergies  Medications: Patient's Medications  New Prescriptions   No medications on file  Previous Medications   ASPIRIN 81 MG TABLET    Take 81 mg by mouth daily.   CLONIDINE (CATAPRES) 0.1 MG TABLET    Take 1 tablet (0.1 mg total) by mouth daily.   FEEDING SUPPLEMENT, ENSURE COMPLETE, (ENSURE COMPLETE) LIQD    Take 237 mLs by mouth 2 (two) times daily between meals.   FUROSEMIDE (LASIX) 40 MG TABLET    TAKE ONE TABLET BY MOUTH ONCE DAILY FOR EDEMA   METOPROLOL TARTRATE (LOPRESSOR) 25 MG TABLET    Take one tablet by mouth once daily for blood pressure   POTASSIUM CHLORIDE (K-DUR) 10 MEQ TABLET    Take 1 tablet (10 mEq total) by mouth daily.  Modified Medications   No medications on file  Discontinued Medications   LEVOFLOXACIN (LEVAQUIN) 750 MG TABLET    Take 1 tablet (750 mg total) by mouth every other day.    Review of Systems  Unable to perform ROS: Dementia    Filed Vitals:   07/15/15 1343  BP: 130/80  Pulse: 72  Temp: 98.7 F (37.1 C)  TempSrc: Oral  Resp: 18  Height: $Remove'5\' 3"'kiYbAPe$  (1.6 m)  Weight: 143 lb (64.864 kg)  SpO2: 95%   Body mass index is 25.34 kg/(m^2).  Physical Exam  Constitutional: She appears well-developed and well-nourished. No distress.  Sitting in w/c. Daughter present  HENT:  Mouth/Throat: Oropharynx is clear and moist. No oropharyngeal exudate.  Eyes: Pupils are equal, round, and reactive to light. No scleral icterus.  Right infraorbital swelling with conjuntival injection and corneal redness. No d/c. PERRL. EOMI intact  Neck: Neck supple. Carotid bruit is not present. No tracheal deviation present. No thyromegaly present.  Cardiovascular: Normal rate, regular rhythm, normal heart sounds, intact distal pulses and normal pulses.  Frequent extrasystoles are present. Exam reveals no gallop and no friction rub.   No murmur heard. Pulses:      Dorsalis pedis pulses are 2+ on the right side, and 2+ on the left side.       Posterior tibial pulses are 2+  on the right side, and 2+ on the left side.  2+ pitting LE edema b/l. TED stockings intact. No calf TTP  Pulmonary/Chest: Effort normal and breath sounds normal. No stridor. No respiratory distress. She has no wheezes. She has no rales.  Abdominal: Soft. Bowel sounds are normal. She exhibits no distension and no mass. There is no hepatomegaly. There is no tenderness. There is no rebound and no guarding.  Musculoskeletal: She exhibits edema and tenderness.       Right wrist: She exhibits decreased range of motion, tenderness, swelling and deformity.       Arms: Lymphadenopathy:    She has no cervical adenopathy.  Neurological: She is alert.  Skin: Skin is warm and dry. No rash noted.  Psychiatric: She has a normal mood and affect. Her behavior is normal.     Labs reviewed: Admission on 06/23/2015, Discharged on 06/24/2015  Component Date Value Ref Range Status  . WBC 06/23/2015 7.8  4.0 - 10.5 K/uL Final  . RBC 06/23/2015 3.19* 3.87 - 5.11 MIL/uL Final  . Hemoglobin 06/23/2015 9.0* 12.0 - 15.0 g/dL Final  . HCT 06/23/2015 28.4* 36.0 - 46.0 % Final  . MCV 06/23/2015 89.0  78.0 - 100.0 fL Final  . MCH 06/23/2015 28.2  26.0 - 34.0 pg Final  . MCHC 06/23/2015 31.7  30.0 - 36.0 g/dL Final  . RDW 06/23/2015 15.8* 11.5 - 15.5 % Final  . Platelets 06/23/2015 288  150 - 400 K/uL Final  . Neutrophils Relative % 06/23/2015 78* 43 - 77 % Final  . Neutro Abs 06/23/2015 6.0  1.7 - 7.7 K/uL Final  . Lymphocytes Relative 06/23/2015 9* 12 - 46 % Final  . Lymphs Abs 06/23/2015 0.7  0.7 - 4.0 K/uL Final  . Monocytes Relative 06/23/2015 11  3 - 12 % Final  . Monocytes Absolute 06/23/2015 0.9  0.1 - 1.0 K/uL Final  . Eosinophils Relative 06/23/2015 2  0 - 5 % Final  . Eosinophils Absolute 06/23/2015 0.1  0.0 - 0.7 K/uL Final  . Basophils Relative 06/23/2015 0  0 - 1 % Final  . Basophils Absolute 06/23/2015 0.0  0.0 - 0.1 K/uL Final  . Sodium 06/23/2015 139  135 - 145 mmol/L Final  . Potassium  06/23/2015 3.2* 3.5 - 5.1 mmol/L Final  . Chloride 06/23/2015 100* 101 - 111 mmol/L Final  . CO2 06/23/2015 31  22 - 32 mmol/L Final  . Glucose, Bld 06/23/2015 106* 65 - 99 mg/dL Final  . BUN 06/23/2015  22* 6 - 20 mg/dL Final  . Creatinine, Ser 06/23/2015 0.82  0.44 - 1.00 mg/dL Final  . Calcium 06/23/2015 8.6* 8.9 - 10.3 mg/dL Final  . Total Protein 06/23/2015 6.9  6.5 - 8.1 g/dL Final  . Albumin 06/23/2015 3.0* 3.5 - 5.0 g/dL Final  . AST 06/23/2015 25  15 - 41 U/L Final  . ALT 06/23/2015 17  14 - 54 U/L Final  . Alkaline Phosphatase 06/23/2015 67  38 - 126 U/L Final  . Total Bilirubin 06/23/2015 0.5  0.3 - 1.2 mg/dL Final  . GFR calc non Af Amer 06/23/2015 59* >60 mL/min Final  . GFR calc Af Amer 06/23/2015 >60  >60 mL/min Final   Comment: (NOTE) The eGFR has been calculated using the CKD EPI equation. This calculation has not been validated in all clinical situations. eGFR's persistently <60 mL/min signify possible Chronic Kidney Disease.   . Anion gap 06/23/2015 8  5 - 15 Final  . Troponin i, poc 06/23/2015 0.04  0.00 - 0.08 ng/mL Final  . Comment 3 06/23/2015          Final   Comment: Due to the release kinetics of cTnI, a negative result within the first hours of the onset of symptoms does not rule out myocardial infarction with certainty. If myocardial infarction is still suspected, repeat the test at appropriate intervals.   . B Natriuretic Peptide 06/23/2015 356.3* 0.0 - 100.0 pg/mL Final  . Lactic Acid, Venous 06/23/2015 0.82  0.5 - 2.0 mmol/L Final  . Fecal Occult Bld 06/23/2015 NEGATIVE  NEGATIVE Final  . Specimen Description 06/23/2015 BLOOD LEFT HAND   Final  . Special Requests 06/23/2015 BOTTLES DRAWN AEROBIC ONLY 5ML   Final  . Culture 06/23/2015    Final                   Value:NO GROWTH 5 DAYS Performed at [facility]   . Report Status 06/23/2015 06/28/2015 FINAL   Final  . Specimen Description 06/23/2015 BLOOD RIGHT HAND   Final  . Special  Requests 06/23/2015 BOTTLES DRAWN AEROBIC AND ANAEROBIC [place]   Final  . Culture 06/23/2015    Final                   Value:NO GROWTH 5 DAYS Performed at [facility]   . Report Status 06/23/2015 06/28/2015 FINAL   Final  . Specimen Description 06/23/2015 URINE, RANDOM   Final  . Special Requests 06/23/2015 NONE   Final  . Legionella Antigen, Urine 06/23/2015    Final                   Value:Negative for Legionella pneumophila serogroup 1                                                              Legionella pneumophila serogroup 1 antigen can be detected in urine within 2 to 3 days of infection and may persist even after treatment. This  assay does not detect other Legionella species or serogroups. Performed at [facility]   . Report Status 06/23/2015 06/27/2015 FINAL   Final  . Strep Pneumo Urinary Antigen 06/23/2015 NEGATIVE  NEGATIVE Final   Comment:        Infection due  to S. pneumoniae cannot be absolutely ruled out since the antigen present may be below the detection limit of the test. Performed at [facility]   . Sodium 06/23/2015 141  135 - 145 mmol/L Final  . Potassium 06/23/2015 3.2* 3.5 - 5.1 mmol/L Final  . Chloride 06/23/2015 99* 101 - 111 mmol/L Final  . CO2 06/23/2015 32  22 - 32 mmol/L Final  . Glucose, Bld 06/23/2015 126* 65 - 99 mg/dL Final  . BUN 06/23/2015 19  6 - 20 mg/dL Final  . Creatinine, Ser 06/23/2015 0.91  0.44 - 1.00 mg/dL Final  . Calcium 06/23/2015 8.5* 8.9 - 10.3 mg/dL Final  . Total Protein 06/23/2015 6.7  6.5 - 8.1 g/dL Final  . Albumin 06/23/2015 2.8* 3.5 - 5.0 g/dL Final  . AST 06/23/2015 24  15 - 41 U/L Final  . ALT 06/23/2015 17  14 - 54 U/L Final  . Alkaline Phosphatase 06/23/2015 63  38 - 126 U/L Final  . Total Bilirubin 06/23/2015 0.5  0.3 - 1.2 mg/dL Final  . GFR calc non Af Amer 06/23/2015 52* >60 mL/min Final  . GFR calc Af Amer 06/23/2015 60* >60 mL/min Final   Comment: (NOTE) The eGFR has been calculated  using the CKD EPI equation. This calculation has not been validated in all clinical situations. eGFR's persistently <60 mL/min signify possible Chronic Kidney Disease.   . Anion gap 06/23/2015 10  5 - 15 Final  . Magnesium 06/23/2015 1.7  1.7 - 2.4 mg/dL Final  . Phosphorus 06/23/2015 2.6  2.5 - 4.6 mg/dL Final  . WBC 06/23/2015 6.6  4.0 - 10.5 K/uL Final  . RBC 06/23/2015 3.28* 3.87 - 5.11 MIL/uL Final  . Hemoglobin 06/23/2015 9.1* 12.0 - 15.0 g/dL Final  . HCT 06/23/2015 29.1* 36.0 - 46.0 % Final  . MCV 06/23/2015 88.7  78.0 - 100.0 fL Final  . MCH 06/23/2015 27.7  26.0 - 34.0 pg Final  . MCHC 06/23/2015 31.3  30.0 - 36.0 g/dL Final  . RDW 06/23/2015 15.5  11.5 - 15.5 % Final  . Platelets 06/23/2015 269  150 - 400 K/uL Final  . Neutrophils Relative % 06/23/2015 75  43 - 77 % Final  . Neutro Abs 06/23/2015 5.0  1.7 - 7.7 K/uL Final  . Lymphocytes Relative 06/23/2015 10* 12 - 46 % Final  . Lymphs Abs 06/23/2015 0.7  0.7 - 4.0 K/uL Final  . Monocytes Relative 06/23/2015 10  3 - 12 % Final  . Monocytes Absolute 06/23/2015 0.7  0.1 - 1.0 K/uL Final  . Eosinophils Relative 06/23/2015 5  0 - 5 % Final  . Eosinophils Absolute 06/23/2015 0.3  0.0 - 0.7 K/uL Final  . Basophils Relative 06/23/2015 0  0 - 1 % Final  . Basophils Absolute 06/23/2015 0.0  0.0 - 0.1 K/uL Final  . aPTT 06/23/2015 28  24 - 37 seconds Final  . Prothrombin Time 06/23/2015 14.4  11.6 - 15.2 seconds Final  . INR 06/23/2015 1.10  0.00 - 1.49 Final  . TSH 06/23/2015 1.440  0.350 - 4.500 uIU/mL Final  . Sodium 06/24/2015 141  135 - 145 mmol/L Final  . Potassium 06/24/2015 3.4* 3.5 - 5.1 mmol/L Final  . Chloride 06/24/2015 99* 101 - 111 mmol/L Final  . CO2 06/24/2015 32  22 - 32 mmol/L Final  . Glucose, Bld 06/24/2015 101* 65 - 99 mg/dL Final  . BUN 06/24/2015 25* 6 - 20 mg/dL Final  . Creatinine, Ser 06/24/2015  1.06* 0.44 - 1.00 mg/dL Final  . Calcium 33/00/5654 8.4* 8.9 - 10.3 mg/dL Final  . Total Protein 06/24/2015  6.3* 6.5 - 8.1 g/dL Final  . Albumin 33/94/7984 2.6* 3.5 - 5.0 g/dL Final  . AST 82/14/7726 23  15 - 41 U/L Final  . ALT 06/24/2015 17  14 - 54 U/L Final  . Alkaline Phosphatase 06/24/2015 62  38 - 126 U/L Final  . Total Bilirubin 06/24/2015 0.3  0.3 - 1.2 mg/dL Final  . GFR calc non Af Amer 06/24/2015 43* >60 mL/min Final  . GFR calc Af Amer 06/24/2015 50* >60 mL/min Final   Comment: (NOTE) The eGFR has been calculated using the CKD EPI equation. This calculation has not been validated in all clinical situations. eGFR's persistently <60 mL/min signify possible Chronic Kidney Disease.   . Anion gap 06/24/2015 10  5 - 15 Final  . WBC 06/24/2015 6.2  4.0 - 10.5 K/uL Final  . RBC 06/24/2015 3.02* 3.87 - 5.11 MIL/uL Final  . Hemoglobin 06/24/2015 8.5* 12.0 - 15.0 g/dL Final  . HCT 77/13/7074 26.8* 36.0 - 46.0 % Final  . MCV 06/24/2015 88.7  78.0 - 100.0 fL Final  . MCH 06/24/2015 28.1  26.0 - 34.0 pg Final  . MCHC 06/24/2015 31.7  30.0 - 36.0 g/dL Final  . RDW 08/89/3448 15.7* 11.5 - 15.5 % Final  . Platelets 06/24/2015 258  150 - 400 K/uL Final  . Glucose-Capillary 06/24/2015 88  65 - 99 mg/dL Final  . Comment 1 11/75/6748 Notify RN   Final    Dg Chest 1 View  06/23/2015   CLINICAL DATA:  Shortness of breath and cough for 1 day  EXAM: CHEST  1 VIEW  COMPARISON:  November 27, 2014  FINDINGS: There is consolidation in the left lower lobe. There is mild generalized interstitial edema. Heart is upper normal in size with pulmonary vascularity within normal limits. No adenopathy. There is atherosclerotic change in aorta.  IMPRESSION: Mild generalized interstitial edema. There may be a degree of underlying congestive heart failure. Airspace consolidation in the left lower lobe appears more consistent with pneumonia than edema.  Followup PA and lateral radiographs recommended in 3-4 weeks following trial of antibiotic therapy to ensure resolution and exclude underlying malignancy.   Electronically  Signed   By: Bretta Bang III M.D.   On: 06/23/2015 10:59     Assessment/Plan   ICD-9-CM ICD-10-CM   1. Physical deconditioning 799.3 R53.81 Ambulatory referral to Home Health  2. Memory loss or impairment - worsening 780.93 R41.3 donepezil (ARICEPT) 5 MG tablet  3. Right forearm injury, initial encounter - s/p fall with hand outstretched  959.3 S59.911A DG Elbow Complete Right     DG Forearm Right     DG Wrist Complete Right  4. Edema - stable 782.3 R60.9   5. Essential hypertension, benign - stable 401.1 I10   6. Chronic congestive heart failure, unspecified congestive heart failure type - stable 428.0 I50.9   7. Chronic renal insufficiency, stage III (moderate) - stable 585.3 N18.9   8. Fall, initial encounter [phone] W19.XXXA DG Elbow Complete Right     DG Forearm Right     DG Wrist Complete Right  9.       Right facial contusion - apply cool compresses prn to OD 10.     Right wrist sprain, severe - apply cool compresses prn to wrist until seen by ortho. Avoid heavy lifting  Continue current medications as ordered  Start generic aricept (for memory loss) tonight  Follow up in 1 month for memory loss  Home health will call for appt  Will call with imaging results.  NOTE: xray revealed no acute fx; refer to Ortho for further mx of severe sprain  Monica S. Perlie Gold  [facility] [address] [place], [place] [address] [phone] Cell (Monday-Friday 8 AM - 5 PM) [phone] After 5 PM and follow prompts

## 2015-07-15 NOTE — Patient Instructions (Signed)
Continue current medications as ordered  Start generic aricept (for memory loss) tonight  Follow up in 1 month for memory loss  Home health will call for appt  Will call with imaging results

## 2015-07-20 DIAGNOSIS — Z8701 Personal history of pneumonia (recurrent): Secondary | ICD-10-CM | POA: Diagnosis not present

## 2015-07-20 DIAGNOSIS — M1990 Unspecified osteoarthritis, unspecified site: Secondary | ICD-10-CM | POA: Diagnosis not present

## 2015-07-20 DIAGNOSIS — I509 Heart failure, unspecified: Secondary | ICD-10-CM | POA: Diagnosis not present

## 2015-07-20 DIAGNOSIS — S59911D Unspecified injury of right forearm, subsequent encounter: Secondary | ICD-10-CM | POA: Diagnosis not present

## 2015-07-20 DIAGNOSIS — Z9181 History of falling: Secondary | ICD-10-CM | POA: Diagnosis not present

## 2015-07-20 DIAGNOSIS — I129 Hypertensive chronic kidney disease with stage 1 through stage 4 chronic kidney disease, or unspecified chronic kidney disease: Secondary | ICD-10-CM | POA: Diagnosis not present

## 2015-07-20 DIAGNOSIS — F0390 Unspecified dementia without behavioral disturbance: Secondary | ICD-10-CM | POA: Diagnosis not present

## 2015-07-20 DIAGNOSIS — R262 Difficulty in walking, not elsewhere classified: Secondary | ICD-10-CM | POA: Diagnosis not present

## 2015-07-20 DIAGNOSIS — N183 Chronic kidney disease, stage 3 (moderate): Secondary | ICD-10-CM | POA: Diagnosis not present

## 2015-07-21 ENCOUNTER — Telehealth: Payer: Self-pay | Admitting: *Deleted

## 2015-07-21 DIAGNOSIS — M25521 Pain in right elbow: Secondary | ICD-10-CM | POA: Diagnosis not present

## 2015-07-21 NOTE — Telephone Encounter (Signed)
Rosey Bath with [facility], nurse, saw patient yesterday for Nursing and PT and would like a verbal order to start OT and Speech due to trouble eating and memory. Order given and they will be faxing the Plan of Care.

## 2015-07-22 DIAGNOSIS — Z8701 Personal history of pneumonia (recurrent): Secondary | ICD-10-CM | POA: Diagnosis not present

## 2015-07-22 DIAGNOSIS — I509 Heart failure, unspecified: Secondary | ICD-10-CM | POA: Diagnosis not present

## 2015-07-22 DIAGNOSIS — Z9181 History of falling: Secondary | ICD-10-CM | POA: Diagnosis not present

## 2015-07-22 DIAGNOSIS — I129 Hypertensive chronic kidney disease with stage 1 through stage 4 chronic kidney disease, or unspecified chronic kidney disease: Secondary | ICD-10-CM | POA: Diagnosis not present

## 2015-07-22 DIAGNOSIS — F0390 Unspecified dementia without behavioral disturbance: Secondary | ICD-10-CM | POA: Diagnosis not present

## 2015-07-22 DIAGNOSIS — S59911D Unspecified injury of right forearm, subsequent encounter: Secondary | ICD-10-CM | POA: Diagnosis not present

## 2015-07-22 DIAGNOSIS — N183 Chronic kidney disease, stage 3 (moderate): Secondary | ICD-10-CM | POA: Diagnosis not present

## 2015-07-22 DIAGNOSIS — R262 Difficulty in walking, not elsewhere classified: Secondary | ICD-10-CM | POA: Diagnosis not present

## 2015-07-22 DIAGNOSIS — M1990 Unspecified osteoarthritis, unspecified site: Secondary | ICD-10-CM | POA: Diagnosis not present

## 2015-07-25 ENCOUNTER — Telehealth: Payer: Self-pay

## 2015-07-25 DIAGNOSIS — Z8701 Personal history of pneumonia (recurrent): Secondary | ICD-10-CM | POA: Diagnosis not present

## 2015-07-25 DIAGNOSIS — F0390 Unspecified dementia without behavioral disturbance: Secondary | ICD-10-CM | POA: Diagnosis not present

## 2015-07-25 DIAGNOSIS — I129 Hypertensive chronic kidney disease with stage 1 through stage 4 chronic kidney disease, or unspecified chronic kidney disease: Secondary | ICD-10-CM | POA: Diagnosis not present

## 2015-07-25 DIAGNOSIS — N183 Chronic kidney disease, stage 3 (moderate): Secondary | ICD-10-CM | POA: Diagnosis not present

## 2015-07-25 DIAGNOSIS — I509 Heart failure, unspecified: Secondary | ICD-10-CM | POA: Diagnosis not present

## 2015-07-25 DIAGNOSIS — R262 Difficulty in walking, not elsewhere classified: Secondary | ICD-10-CM | POA: Diagnosis not present

## 2015-07-25 DIAGNOSIS — Z9181 History of falling: Secondary | ICD-10-CM | POA: Diagnosis not present

## 2015-07-25 DIAGNOSIS — S59911D Unspecified injury of right forearm, subsequent encounter: Secondary | ICD-10-CM | POA: Diagnosis not present

## 2015-07-25 DIAGNOSIS — M1990 Unspecified osteoarthritis, unspecified site: Secondary | ICD-10-CM | POA: Diagnosis not present

## 2015-07-25 MED ORDER — POTASSIUM CHLORIDE ER 10 MEQ PO TBCR: 10.0000 meq | EXTENDED_RELEASE_TABLET | Freq: Every day | ORAL | Status: DC

## 2015-07-25 MED ORDER — CLONIDINE HCL 0.1 MG PO TABS: 0.1000 mg | ORAL_TABLET | Freq: Every day | ORAL | Status: DC

## 2015-07-25 NOTE — Telephone Encounter (Signed)
Daughter called, they had tried to get refill on Potassium and Clonidine but was denied. I will call Rx's into [facility]

## 2015-07-26 ENCOUNTER — Telehealth: Payer: Self-pay

## 2015-07-26 DIAGNOSIS — Z9181 History of falling: Secondary | ICD-10-CM | POA: Diagnosis not present

## 2015-07-26 DIAGNOSIS — N183 Chronic kidney disease, stage 3 (moderate): Secondary | ICD-10-CM | POA: Diagnosis not present

## 2015-07-26 DIAGNOSIS — I129 Hypertensive chronic kidney disease with stage 1 through stage 4 chronic kidney disease, or unspecified chronic kidney disease: Secondary | ICD-10-CM | POA: Diagnosis not present

## 2015-07-26 DIAGNOSIS — M1990 Unspecified osteoarthritis, unspecified site: Secondary | ICD-10-CM | POA: Diagnosis not present

## 2015-07-26 DIAGNOSIS — R262 Difficulty in walking, not elsewhere classified: Secondary | ICD-10-CM | POA: Diagnosis not present

## 2015-07-26 DIAGNOSIS — Z8701 Personal history of pneumonia (recurrent): Secondary | ICD-10-CM | POA: Diagnosis not present

## 2015-07-26 DIAGNOSIS — I509 Heart failure, unspecified: Secondary | ICD-10-CM | POA: Diagnosis not present

## 2015-07-26 DIAGNOSIS — F0390 Unspecified dementia without behavioral disturbance: Secondary | ICD-10-CM | POA: Diagnosis not present

## 2015-07-26 DIAGNOSIS — S59911D Unspecified injury of right forearm, subsequent encounter: Secondary | ICD-10-CM | POA: Diagnosis not present

## 2015-07-26 NOTE — Telephone Encounter (Signed)
Called spoke with Ms. Rachel Hickman informed her that Dr. Montez Morita had said it was okay for the PT., and I informed her of the go ahead with  verbal order.

## 2015-07-26 NOTE — Telephone Encounter (Signed)
-----   Message from [place], [place] sent at 07/25/2015  4:53 PM EDT ----- Contact: [phone] Ok for PT as requested  ----- Message -----    From: London Sheer, CMA    Sent: 07/25/2015   2:44 PM      To: Kirt Boys, DO  Dr. Montez Morita a [place] called from the home needing a verbal referral for this patient to receive PT for 2 x a week and then 1 week at once a week.here is her number [phone] ) she is waiting for your call.

## 2015-07-26 NOTE — Telephone Encounter (Signed)
error 

## 2015-07-27 ENCOUNTER — Encounter: Payer: Self-pay | Admitting: Cardiovascular Disease

## 2015-07-27 ENCOUNTER — Ambulatory Visit (INDEPENDENT_AMBULATORY_CARE_PROVIDER_SITE_OTHER): Payer: Medicare Other | Admitting: Cardiovascular Disease

## 2015-07-27 VITALS — BP 162/92 | HR 71 | Ht 63.5 in | Wt 137.5 lb

## 2015-07-27 DIAGNOSIS — R600 Localized edema: Secondary | ICD-10-CM | POA: Diagnosis not present

## 2015-07-27 DIAGNOSIS — M1990 Unspecified osteoarthritis, unspecified site: Secondary | ICD-10-CM | POA: Diagnosis not present

## 2015-07-27 DIAGNOSIS — S59911D Unspecified injury of right forearm, subsequent encounter: Secondary | ICD-10-CM | POA: Diagnosis not present

## 2015-07-27 DIAGNOSIS — N183 Chronic kidney disease, stage 3 (moderate): Secondary | ICD-10-CM | POA: Diagnosis not present

## 2015-07-27 DIAGNOSIS — I340 Nonrheumatic mitral (valve) insufficiency: Secondary | ICD-10-CM

## 2015-07-27 DIAGNOSIS — Z9181 History of falling: Secondary | ICD-10-CM | POA: Diagnosis not present

## 2015-07-27 DIAGNOSIS — R262 Difficulty in walking, not elsewhere classified: Secondary | ICD-10-CM | POA: Diagnosis not present

## 2015-07-27 DIAGNOSIS — I10 Essential (primary) hypertension: Secondary | ICD-10-CM

## 2015-07-27 DIAGNOSIS — I129 Hypertensive chronic kidney disease with stage 1 through stage 4 chronic kidney disease, or unspecified chronic kidney disease: Secondary | ICD-10-CM | POA: Diagnosis not present

## 2015-07-27 DIAGNOSIS — Z8701 Personal history of pneumonia (recurrent): Secondary | ICD-10-CM | POA: Diagnosis not present

## 2015-07-27 DIAGNOSIS — F0390 Unspecified dementia without behavioral disturbance: Secondary | ICD-10-CM | POA: Diagnosis not present

## 2015-07-27 DIAGNOSIS — I509 Heart failure, unspecified: Secondary | ICD-10-CM | POA: Diagnosis not present

## 2015-07-27 MED ORDER — FUROSEMIDE 40 MG PO TABS: 40.0000 mg | ORAL_TABLET | Freq: Every day | ORAL | Status: DC

## 2015-07-27 NOTE — Patient Instructions (Addendum)
Please take lasix 40 mg twice daily for three days.  Then take lasix 40 mg daily.    Please weigh yourself daily.  If your weight is up more than two pounds in one day or five pounds in one week, please take lasix 40 mg twice daily for two days.  Your physician recommends that you schedule a follow-up appointment in 6 months with Dr Duke Salvia.

## 2015-07-27 NOTE — Progress Notes (Signed)
Cardiology Office Note   Date:  07/27/2015   ID:  [name], DOB [birth year], MRN [id number]  PCP:  Kirt Boys, DO  Cardiologist:   Madilyn Hook, MD   Chief Complaint  Patient presents with  . New Evaluation    NO CHEST PAIN , NO SOB, SWELLING - WEARING COMPRESSION STOCKING      History of Present Illness: Rachel Hickman is a 81 y.o. female with hypertension, bradycardia and moderate TR/MR who presents for an evaluation of lower extremity edema.  She is accompanied by her daughter who provides much of the history.  She has been taking lasix 20 mg daily that was increased to 40 mg.  However she continued to have edema.  She was switched to bumex but had intravascular volume depletion requiring hospitalization.  Rachel Hickman denies chest pain, shortness of breath, palpitations, lightheadedness, dizziness, orthopnea, or PND. She states that her breathing is not labored by her daughter does feel as though sometimes she is breathing heavy. It does not seem to be any worse with exertion. Her daughter also notes occasional episodes of apnea when she is sleeping.  Rachel Hickman reports several falls.  The last occurred in September. She attributed to gait instability. She denies any preceding chest pain, lightheadedness, or palpitations. She continues to walk with a walker and her only limitation is arthritis.  Rachel Hickman was seen in the ED on 9/1 with cough and shortness of breath.  She was treated for pneumonia.  She has been wearing TED hose since Sept 1, has helped with the edema.  She lives with her daughter and Chip Boer PT/OT and speech therapy come to her for services.   Past Medical History  Diagnosis Date  . Hypertension   . Arthritis   . Confusion   . Acute kidney injury (HCC)   . Bradycardia   . Disorder of pineal gland     Past Surgical History  Procedure Laterality Date  . Cesarean section       Current Outpatient Prescriptions  Medication Sig Dispense Refill  .  aspirin 81 MG tablet Take 81 mg by mouth daily.    . cloNIDine (CATAPRES) 0.1 MG tablet Take 1 tablet (0.1 mg total) by mouth daily. 30 tablet 2  . donepezil (ARICEPT) 5 MG tablet Take 1 tablet (5 mg total) by mouth at bedtime. 30 tablet 1  . feeding supplement, ENSURE COMPLETE, (ENSURE COMPLETE) LIQD Take 237 mLs by mouth 2 (two) times daily between meals.    . furosemide (LASIX) 40 MG tablet Take 1 tablet (40 mg total) by mouth daily. Take an extra tablet as needed for swelling. 60 tablet 11  . metoprolol tartrate (LOPRESSOR) 25 MG tablet Take one tablet by mouth once daily for blood pressure (Patient taking differently: Take 25 mg by mouth daily. ) 90 tablet 1  . potassium chloride (K-DUR) 10 MEQ tablet Take 1 tablet (10 mEq total) by mouth daily. 30 tablet 2   No current facility-administered medications for this visit.    Allergies:   Review of patient's allergies indicates no known allergies.    Social History:  The patient  reports that she has never smoked. She does not have any smokeless tobacco history on file. She reports that she does not drink alcohol or use illicit drugs.   Family History:  The patient's family history includes Cancer in her brother, brother, and mother; Diabetes in her brother; Hypertension in an other family member.  ROS:  Please see the history of present illness.   Otherwise, review of systems are positive for none.   All other systems are reviewed and negative.    PHYSICAL EXAM: VS:  BP 162/92 mmHg  Pulse 71  Ht 5' 3.5" (1.613 m)  Wt 62.37 kg (137 lb 8 oz)  BMI 23.97 kg/m2 , BMI Body mass index is 23.97 kg/(m^2). GENERAL:  Well appearing HEENT:  Pupils equal round and reactive, fundi not visualized, oral mucosa unremarkable NECK:  JVP 3cm above clavicle sitting upright.  Waveform with prominent CV waves, carotid upstroke brisk and symmetric, no bruits, no thyromegaly LYMPHATICS:  No cervical adenopathy LUNGS:  Clear to auscultation  bilaterally HEART:  RRR.  PMI not displaced or sustained,S1 and S2 within normal limits, no S3, no S4, no clicks, no rubs,III/VI holosytolic murmur at the apex ABD:  Flat, positive bowel sounds normal in frequency in pitch, no bruits, no rebound, no guarding, no midline pulsatile mass, no hepatomegaly, no splenomegaly EXT:  2 plus pulses throughout, no edema, no cyanosis no clubbing SKIN:  No rashes no nodules NEURO:  Cranial nerves II through XII grossly intact, motor grossly intact throughout PSYCH:  Cognitively intact, oriented to person place and time    EKG:  EKG is ordered today. The ekg ordered today demonstrates sinus rhythm at 71 bpm.  LVH with secondary repolarization abnormalities.  One PVC.  Echo 03/01/15: Study Conclusions  - Left ventricle: Severe basal septal hypertrophy. The cavity size was moderately dilated. Systolic function was normal. The estimated ejection fraction was in the range of 50% to 55%. - Aortic valve: There was mild regurgitation. - Mitral valve: Moderate eccentric MR directed posteriorly mechanism not clear ? restricted posterior leaflet There was moderate regurgitation. - Left atrium: The atrium was severely dilated. - Right atrium: The atrium was mildly dilated. - Atrial septum: No defect or patent foramen ovale was identified. - Tricuspid valve: There was moderate regurgitation.  Recent Labs: 06/23/2015: B Natriuretic Peptide 356.3*; Magnesium 1.7; TSH 1.440 06/24/2015: ALT 17; BUN 25*; Creatinine, Ser 1.06*; Hemoglobin 8.5*; Platelets 258; Potassium 3.4*; Sodium 141    Lipid Panel No results found for: CHOL, TRIG, HDL, CHOLHDL, VLDL, LDLCALC, LDLDIRECT    Wt Readings from Last 3 Encounters:  07/27/15 62.37 kg (137 lb 8 oz)  07/15/15 64.864 kg (143 lb)  06/24/15 68.04 kg (150 lb)      Other studies Reviewed: Additional studies/ records that were reviewed today include: \Review of the above records demonstrates:  Please see elsewhere  in the note.     ASSESSMENT AND PLAN:  # Moderate MR/TR, LE edema: Rachel Hickman appears volume overloaded today. She has edema to her ankles and her neck veins are elevated, though her neck veins are also more pronounced due to the tricuspid regurgitation. She is sensitive to over diuresis. Therefore we asked her to increase her Lasix to 40 mg twice a day for 3 days. After which time she will return back to 40 mg daily. She will check her weight daily. If her weight goes up by more than 2 pounds in a day or more than 5 pounds in a week, she will take Lasix 40 mg twice a day for 2 days. After that she will resume her once daily Lasix dosing.  # Hypertension: BP is elevated today.  However, her daughter reports that it is typically well controlled. Given her falls and her age I'm leery of increasing her antihypertensives to much. Ideally I would  like her blood pressure to be in the 130s to 140s systolic. Higher blood pressures will increase the severity of her mitral regurgitation. Therefore do not want her blood pressures to be too high. Her daughter will check her blood pressure over the next 2 weeks and give [place] a call if her systolic blood pressures are consistently above 140.  Ideally she would not be on clonidine. If her blood pressures are elevated we will likely discontinue clonidine in favor of amlodipine.   Current medicines are reviewed at length with the patient today.  The patient does not have concerns regarding medicines.  The following changes have been made:  no change  Labs/ tests ordered today include:   Orders Placed This Encounter  Procedures  . EKG 12-Lead     Disposition:   FU with Phila Shoaf C. Duke Salvia, MD in 6 months.   Signed, Madilyn Hook, MD  07/27/2015 3:25 PM    [facility]

## 2015-07-28 ENCOUNTER — Telehealth: Payer: Self-pay

## 2015-07-28 NOTE — Telephone Encounter (Signed)
French Ana from home health for Ms. Wilber called to say that patient missed her appointment yesterday for her home health but would be going tomorrow

## 2015-07-29 DIAGNOSIS — Z9181 History of falling: Secondary | ICD-10-CM | POA: Diagnosis not present

## 2015-07-29 DIAGNOSIS — M1990 Unspecified osteoarthritis, unspecified site: Secondary | ICD-10-CM | POA: Diagnosis not present

## 2015-07-29 DIAGNOSIS — I509 Heart failure, unspecified: Secondary | ICD-10-CM | POA: Diagnosis not present

## 2015-07-29 DIAGNOSIS — R262 Difficulty in walking, not elsewhere classified: Secondary | ICD-10-CM | POA: Diagnosis not present

## 2015-07-29 DIAGNOSIS — I129 Hypertensive chronic kidney disease with stage 1 through stage 4 chronic kidney disease, or unspecified chronic kidney disease: Secondary | ICD-10-CM | POA: Diagnosis not present

## 2015-07-29 DIAGNOSIS — S59911D Unspecified injury of right forearm, subsequent encounter: Secondary | ICD-10-CM | POA: Diagnosis not present

## 2015-07-29 DIAGNOSIS — Z8701 Personal history of pneumonia (recurrent): Secondary | ICD-10-CM | POA: Diagnosis not present

## 2015-07-29 DIAGNOSIS — N183 Chronic kidney disease, stage 3 (moderate): Secondary | ICD-10-CM | POA: Diagnosis not present

## 2015-07-29 DIAGNOSIS — F0390 Unspecified dementia without behavioral disturbance: Secondary | ICD-10-CM | POA: Diagnosis not present

## 2015-08-03 ENCOUNTER — Telehealth: Payer: Self-pay

## 2015-08-03 DIAGNOSIS — Z8701 Personal history of pneumonia (recurrent): Secondary | ICD-10-CM | POA: Diagnosis not present

## 2015-08-03 DIAGNOSIS — M1990 Unspecified osteoarthritis, unspecified site: Secondary | ICD-10-CM | POA: Diagnosis not present

## 2015-08-03 DIAGNOSIS — S59911D Unspecified injury of right forearm, subsequent encounter: Secondary | ICD-10-CM | POA: Diagnosis not present

## 2015-08-03 DIAGNOSIS — Z9181 History of falling: Secondary | ICD-10-CM | POA: Diagnosis not present

## 2015-08-03 DIAGNOSIS — F0390 Unspecified dementia without behavioral disturbance: Secondary | ICD-10-CM | POA: Diagnosis not present

## 2015-08-03 DIAGNOSIS — N183 Chronic kidney disease, stage 3 (moderate): Secondary | ICD-10-CM | POA: Diagnosis not present

## 2015-08-03 DIAGNOSIS — I509 Heart failure, unspecified: Secondary | ICD-10-CM | POA: Diagnosis not present

## 2015-08-03 DIAGNOSIS — R262 Difficulty in walking, not elsewhere classified: Secondary | ICD-10-CM | POA: Diagnosis not present

## 2015-08-03 DIAGNOSIS — I129 Hypertensive chronic kidney disease with stage 1 through stage 4 chronic kidney disease, or unspecified chronic kidney disease: Secondary | ICD-10-CM | POA: Diagnosis not present

## 2015-08-03 NOTE — Telephone Encounter (Signed)
Message left on triage voicemail. Rachel Hickman from [facility] called stating patient's daughter is a Public house managerLPN and does not feel like patient needs nursing services from [facility]. Patient would like services discontinued. Please advise if you agree with patient's wishes.

## 2015-08-03 NOTE — Telephone Encounter (Signed)
I disagree with daughter. Pt is deconditioned and has worsening of memory + recent fall and wrist injry. She needs nursing services

## 2015-08-04 DIAGNOSIS — Z9181 History of falling: Secondary | ICD-10-CM | POA: Diagnosis not present

## 2015-08-04 DIAGNOSIS — I509 Heart failure, unspecified: Secondary | ICD-10-CM | POA: Diagnosis not present

## 2015-08-04 DIAGNOSIS — S59911D Unspecified injury of right forearm, subsequent encounter: Secondary | ICD-10-CM | POA: Diagnosis not present

## 2015-08-04 DIAGNOSIS — I129 Hypertensive chronic kidney disease with stage 1 through stage 4 chronic kidney disease, or unspecified chronic kidney disease: Secondary | ICD-10-CM | POA: Diagnosis not present

## 2015-08-04 DIAGNOSIS — N183 Chronic kidney disease, stage 3 (moderate): Secondary | ICD-10-CM | POA: Diagnosis not present

## 2015-08-04 DIAGNOSIS — Z8701 Personal history of pneumonia (recurrent): Secondary | ICD-10-CM | POA: Diagnosis not present

## 2015-08-04 DIAGNOSIS — F0390 Unspecified dementia without behavioral disturbance: Secondary | ICD-10-CM | POA: Diagnosis not present

## 2015-08-04 DIAGNOSIS — R262 Difficulty in walking, not elsewhere classified: Secondary | ICD-10-CM | POA: Diagnosis not present

## 2015-08-04 DIAGNOSIS — M1990 Unspecified osteoarthritis, unspecified site: Secondary | ICD-10-CM | POA: Diagnosis not present

## 2015-08-04 NOTE — Telephone Encounter (Signed)
I called Rachel Hickman and she stated that patient's daughter is ok with patient receiving physical therapy but would like for today to be the last day for nursing services.This is scheduled to end today as of 3 pm.   Nursing Services include- teaching and assessment   I called home number on file to follow-up with patient's daughter and was unable to make a connection. Recording stated patient is unavailable and try call again later.

## 2015-08-04 NOTE — Telephone Encounter (Signed)
Patient's daughter called back and stated she would like services d/c for all she is doing is education and checking blood pressure. Patient's daughter Sharia ReeveLavon is a LPN and can perform these services herself. Sharia ReeveLavon states its a little overwhelming having so many people coming out.   I called Rosey Batheresa and informed her of final decision to d/c nursing services only and for other services to continue

## 2015-08-04 NOTE — Telephone Encounter (Signed)
Noted  

## 2015-08-05 DIAGNOSIS — I129 Hypertensive chronic kidney disease with stage 1 through stage 4 chronic kidney disease, or unspecified chronic kidney disease: Secondary | ICD-10-CM | POA: Diagnosis not present

## 2015-08-05 DIAGNOSIS — Z8701 Personal history of pneumonia (recurrent): Secondary | ICD-10-CM | POA: Diagnosis not present

## 2015-08-05 DIAGNOSIS — R262 Difficulty in walking, not elsewhere classified: Secondary | ICD-10-CM | POA: Diagnosis not present

## 2015-08-05 DIAGNOSIS — S59911D Unspecified injury of right forearm, subsequent encounter: Secondary | ICD-10-CM | POA: Diagnosis not present

## 2015-08-05 DIAGNOSIS — N183 Chronic kidney disease, stage 3 (moderate): Secondary | ICD-10-CM | POA: Diagnosis not present

## 2015-08-05 DIAGNOSIS — M1990 Unspecified osteoarthritis, unspecified site: Secondary | ICD-10-CM | POA: Diagnosis not present

## 2015-08-05 DIAGNOSIS — Z9181 History of falling: Secondary | ICD-10-CM | POA: Diagnosis not present

## 2015-08-05 DIAGNOSIS — I509 Heart failure, unspecified: Secondary | ICD-10-CM | POA: Diagnosis not present

## 2015-08-05 DIAGNOSIS — F0390 Unspecified dementia without behavioral disturbance: Secondary | ICD-10-CM | POA: Diagnosis not present

## 2015-08-06 DIAGNOSIS — M1990 Unspecified osteoarthritis, unspecified site: Secondary | ICD-10-CM | POA: Diagnosis not present

## 2015-08-06 DIAGNOSIS — I129 Hypertensive chronic kidney disease with stage 1 through stage 4 chronic kidney disease, or unspecified chronic kidney disease: Secondary | ICD-10-CM | POA: Diagnosis not present

## 2015-08-06 DIAGNOSIS — I509 Heart failure, unspecified: Secondary | ICD-10-CM | POA: Diagnosis not present

## 2015-08-06 DIAGNOSIS — R262 Difficulty in walking, not elsewhere classified: Secondary | ICD-10-CM | POA: Diagnosis not present

## 2015-08-06 DIAGNOSIS — F0390 Unspecified dementia without behavioral disturbance: Secondary | ICD-10-CM | POA: Diagnosis not present

## 2015-08-06 DIAGNOSIS — N183 Chronic kidney disease, stage 3 (moderate): Secondary | ICD-10-CM | POA: Diagnosis not present

## 2015-08-06 DIAGNOSIS — S59911D Unspecified injury of right forearm, subsequent encounter: Secondary | ICD-10-CM | POA: Diagnosis not present

## 2015-08-06 DIAGNOSIS — Z8701 Personal history of pneumonia (recurrent): Secondary | ICD-10-CM | POA: Diagnosis not present

## 2015-08-06 DIAGNOSIS — Z9181 History of falling: Secondary | ICD-10-CM | POA: Diagnosis not present

## 2015-08-09 DIAGNOSIS — Z8701 Personal history of pneumonia (recurrent): Secondary | ICD-10-CM | POA: Diagnosis not present

## 2015-08-09 DIAGNOSIS — M1990 Unspecified osteoarthritis, unspecified site: Secondary | ICD-10-CM | POA: Diagnosis not present

## 2015-08-09 DIAGNOSIS — F0390 Unspecified dementia without behavioral disturbance: Secondary | ICD-10-CM | POA: Diagnosis not present

## 2015-08-09 DIAGNOSIS — R262 Difficulty in walking, not elsewhere classified: Secondary | ICD-10-CM | POA: Diagnosis not present

## 2015-08-09 DIAGNOSIS — I129 Hypertensive chronic kidney disease with stage 1 through stage 4 chronic kidney disease, or unspecified chronic kidney disease: Secondary | ICD-10-CM | POA: Diagnosis not present

## 2015-08-09 DIAGNOSIS — S59911D Unspecified injury of right forearm, subsequent encounter: Secondary | ICD-10-CM | POA: Diagnosis not present

## 2015-08-09 DIAGNOSIS — N183 Chronic kidney disease, stage 3 (moderate): Secondary | ICD-10-CM | POA: Diagnosis not present

## 2015-08-09 DIAGNOSIS — Z9181 History of falling: Secondary | ICD-10-CM | POA: Diagnosis not present

## 2015-08-09 DIAGNOSIS — I509 Heart failure, unspecified: Secondary | ICD-10-CM | POA: Diagnosis not present

## 2015-08-12 DIAGNOSIS — I509 Heart failure, unspecified: Secondary | ICD-10-CM | POA: Diagnosis not present

## 2015-08-12 DIAGNOSIS — S59911D Unspecified injury of right forearm, subsequent encounter: Secondary | ICD-10-CM | POA: Diagnosis not present

## 2015-08-12 DIAGNOSIS — R262 Difficulty in walking, not elsewhere classified: Secondary | ICD-10-CM | POA: Diagnosis not present

## 2015-08-12 DIAGNOSIS — I129 Hypertensive chronic kidney disease with stage 1 through stage 4 chronic kidney disease, or unspecified chronic kidney disease: Secondary | ICD-10-CM | POA: Diagnosis not present

## 2015-08-12 DIAGNOSIS — M1990 Unspecified osteoarthritis, unspecified site: Secondary | ICD-10-CM | POA: Diagnosis not present

## 2015-08-12 DIAGNOSIS — Z9181 History of falling: Secondary | ICD-10-CM | POA: Diagnosis not present

## 2015-08-12 DIAGNOSIS — Z8701 Personal history of pneumonia (recurrent): Secondary | ICD-10-CM | POA: Diagnosis not present

## 2015-08-12 DIAGNOSIS — N183 Chronic kidney disease, stage 3 (moderate): Secondary | ICD-10-CM | POA: Diagnosis not present

## 2015-08-12 DIAGNOSIS — F0390 Unspecified dementia without behavioral disturbance: Secondary | ICD-10-CM | POA: Diagnosis not present

## 2015-08-17 DIAGNOSIS — S59911D Unspecified injury of right forearm, subsequent encounter: Secondary | ICD-10-CM | POA: Diagnosis not present

## 2015-08-17 DIAGNOSIS — Z9181 History of falling: Secondary | ICD-10-CM | POA: Diagnosis not present

## 2015-08-17 DIAGNOSIS — M1990 Unspecified osteoarthritis, unspecified site: Secondary | ICD-10-CM | POA: Diagnosis not present

## 2015-08-17 DIAGNOSIS — R262 Difficulty in walking, not elsewhere classified: Secondary | ICD-10-CM | POA: Diagnosis not present

## 2015-08-17 DIAGNOSIS — Z8701 Personal history of pneumonia (recurrent): Secondary | ICD-10-CM | POA: Diagnosis not present

## 2015-08-17 DIAGNOSIS — F0390 Unspecified dementia without behavioral disturbance: Secondary | ICD-10-CM | POA: Diagnosis not present

## 2015-08-17 DIAGNOSIS — N183 Chronic kidney disease, stage 3 (moderate): Secondary | ICD-10-CM | POA: Diagnosis not present

## 2015-08-17 DIAGNOSIS — I509 Heart failure, unspecified: Secondary | ICD-10-CM | POA: Diagnosis not present

## 2015-08-17 DIAGNOSIS — I129 Hypertensive chronic kidney disease with stage 1 through stage 4 chronic kidney disease, or unspecified chronic kidney disease: Secondary | ICD-10-CM | POA: Diagnosis not present

## 2015-08-19 DIAGNOSIS — R262 Difficulty in walking, not elsewhere classified: Secondary | ICD-10-CM | POA: Diagnosis not present

## 2015-08-19 DIAGNOSIS — I509 Heart failure, unspecified: Secondary | ICD-10-CM | POA: Diagnosis not present

## 2015-08-19 DIAGNOSIS — Z9181 History of falling: Secondary | ICD-10-CM | POA: Diagnosis not present

## 2015-08-19 DIAGNOSIS — Z8701 Personal history of pneumonia (recurrent): Secondary | ICD-10-CM | POA: Diagnosis not present

## 2015-08-19 DIAGNOSIS — F0390 Unspecified dementia without behavioral disturbance: Secondary | ICD-10-CM | POA: Diagnosis not present

## 2015-08-19 DIAGNOSIS — S59911D Unspecified injury of right forearm, subsequent encounter: Secondary | ICD-10-CM | POA: Diagnosis not present

## 2015-08-19 DIAGNOSIS — M1990 Unspecified osteoarthritis, unspecified site: Secondary | ICD-10-CM | POA: Diagnosis not present

## 2015-08-19 DIAGNOSIS — I129 Hypertensive chronic kidney disease with stage 1 through stage 4 chronic kidney disease, or unspecified chronic kidney disease: Secondary | ICD-10-CM | POA: Diagnosis not present

## 2015-08-19 DIAGNOSIS — N183 Chronic kidney disease, stage 3 (moderate): Secondary | ICD-10-CM | POA: Diagnosis not present

## 2015-08-26 DIAGNOSIS — N183 Chronic kidney disease, stage 3 (moderate): Secondary | ICD-10-CM | POA: Diagnosis not present

## 2015-08-26 DIAGNOSIS — F0390 Unspecified dementia without behavioral disturbance: Secondary | ICD-10-CM | POA: Diagnosis not present

## 2015-08-26 DIAGNOSIS — R262 Difficulty in walking, not elsewhere classified: Secondary | ICD-10-CM | POA: Diagnosis not present

## 2015-08-26 DIAGNOSIS — I129 Hypertensive chronic kidney disease with stage 1 through stage 4 chronic kidney disease, or unspecified chronic kidney disease: Secondary | ICD-10-CM | POA: Diagnosis not present

## 2015-08-26 DIAGNOSIS — S59911D Unspecified injury of right forearm, subsequent encounter: Secondary | ICD-10-CM | POA: Diagnosis not present

## 2015-08-26 DIAGNOSIS — I509 Heart failure, unspecified: Secondary | ICD-10-CM | POA: Diagnosis not present

## 2015-08-26 DIAGNOSIS — M1990 Unspecified osteoarthritis, unspecified site: Secondary | ICD-10-CM | POA: Diagnosis not present

## 2015-08-26 DIAGNOSIS — Z9181 History of falling: Secondary | ICD-10-CM | POA: Diagnosis not present

## 2015-08-26 DIAGNOSIS — Z8701 Personal history of pneumonia (recurrent): Secondary | ICD-10-CM | POA: Diagnosis not present

## 2015-08-31 ENCOUNTER — Encounter: Payer: Self-pay | Admitting: Internal Medicine

## 2015-08-31 ENCOUNTER — Ambulatory Visit (INDEPENDENT_AMBULATORY_CARE_PROVIDER_SITE_OTHER): Payer: Medicare Other | Admitting: Internal Medicine

## 2015-08-31 VITALS — BP 128/72 | HR 68 | Temp 98.1°F | Resp 20 | Ht 64.0 in | Wt 135.0 lb

## 2015-08-31 DIAGNOSIS — R600 Localized edema: Secondary | ICD-10-CM

## 2015-08-31 DIAGNOSIS — R413 Other amnesia: Secondary | ICD-10-CM | POA: Diagnosis not present

## 2015-08-31 DIAGNOSIS — M2550 Pain in unspecified joint: Secondary | ICD-10-CM

## 2015-08-31 DIAGNOSIS — R5381 Other malaise: Secondary | ICD-10-CM | POA: Diagnosis not present

## 2015-08-31 MED ORDER — DONEPEZIL HCL 10 MG PO TABS: 10.0000 mg | ORAL_TABLET | Freq: Every day | ORAL | Status: DC

## 2015-08-31 MED ORDER — AMBULATORY NON FORMULARY MEDICATION: Status: AC

## 2015-08-31 NOTE — Patient Instructions (Signed)
Increase aricept 10mg  at bedtime  Continue other medications as ordered  Handicap placard completed  Follow up in 3 mos for routine visit

## 2015-08-31 NOTE — Progress Notes (Signed)
Patient ID: Rachel Hickman, female   DOB: December 17, 1916, 81 y.o.   MRN: [id number]    Location:    [facility]   Place of Service:  OFFICE   Chief Complaint  Patient presents with  . Medical Management of Chronic Issues    1 month follow-up for memory loss, Hypertension   . Immunizations    Discuss Prevnar 13    HPI:  81 yo female seen today for f/u memory loss. She was started on aricept at last OV. She has tolerated med. No diarrhea, nightmares or nausea. No change noted in mentation. She has completed PT/OT. Daughter requests w/c for transport. She has difficulty walking due to arthritis in knees and chronic swelling in LE. She requires significant assistance when leaving home due to pain in joints and dementia. She easily gets winded with walking sort disitances. Uses walker at home but requires w/c when traveling outside home  Need handicap placard app completed as hers is about to expire  She had both prevnar and pneumovax already. No further immunization req'd  Past Medical History  Diagnosis Date  . Hypertension   . Arthritis   . Confusion   . Acute kidney injury ([place])   . Bradycardia   . Disorder of pineal gland     Past Surgical History  Procedure Laterality Date  . Cesarean section      Patient Care Team: Gildardo Cranker, DO as PCP - General (Internal Medicine)  Social History   Social History  . Marital Status: Widowed    Spouse Name: N/A  . Number of Children: N/A  . Years of Education: N/A   Occupational History  . Not on file.   Social History Main Topics  . Smoking status: Never Smoker   . Smokeless tobacco: Never Used  . Alcohol Use: No  . Drug Use: No  . Sexual Activity: Not on file   Other Topics Concern  . Not on file   Social History Narrative   Diet- Yes, Healthy heart   Caffeine- Yes   Married- 1946, Widowed   House- Yes, moved in with daughter (11/30/14)   Pets- No   Current/past profession-   Exercise- Infrequently   Living will- No   DNR-No   POA/HPOA-No           reports that she has never smoked. She has never used smokeless tobacco. She reports that she does not drink alcohol or use illicit drugs.  No Known Allergies  Medications: Patient's Medications  New Prescriptions   No medications on file  Previous Medications   ASPIRIN 81 MG TABLET    Take 81 mg by mouth daily.   CLONIDINE (CATAPRES) 0.1 MG TABLET    Take 1 tablet (0.1 mg total) by mouth daily.   DONEPEZIL (ARICEPT) 5 MG TABLET    Take 1 tablet (5 mg total) by mouth at bedtime.   FEEDING SUPPLEMENT, ENSURE COMPLETE, (ENSURE COMPLETE) LIQD    Take 237 mLs by mouth 2 (two) times daily between meals.   FUROSEMIDE (LASIX) 40 MG TABLET    Take 1 tablet (40 mg total) by mouth daily. Take an extra tablet as needed for swelling.   METOPROLOL TARTRATE (LOPRESSOR) 25 MG TABLET    Take one tablet by mouth once daily for blood pressure   POTASSIUM CHLORIDE (K-DUR) 10 MEQ TABLET    Take 1 tablet (10 mEq total) by mouth daily.  Modified Medications   No medications on file  Discontinued Medications  No medications on file    Review of Systems  Unable to perform ROS: Other    Filed Vitals:   08/31/15 1532  BP: 128/72  Pulse: 68  Temp: 98.1 F (36.7 C)  TempSrc: Oral  Resp: 20  Height: $Remove'5\' 4"'xsPoKrg$  (1.626 m)  Weight: 135 lb (61.236 kg)  SpO2: 97%   Body mass index is 23.16 kg/(m^2).  Physical Exam  Constitutional: She appears well-developed. No distress.  Frail appearing in NAD  Cardiovascular: Normal rate and regular rhythm.  Frequent extrasystoles are present. Exam reveals no gallop and no friction rub.   Murmur heard.  Systolic murmur is present with a grade of 1/6  +1 pitting LE edema b/l. No calf TTP. TED stockings intact b/l  Pulmonary/Chest: Effort normal and breath sounds normal. No respiratory distress. She has no wheezes. She has no rales. She exhibits no tenderness.  Musculoskeletal: She exhibits edema and tenderness.  [place] and large joint  swelling and deformities  Neurological: She is alert.  Skin: Skin is warm and dry. No rash noted.  Psychiatric: She has a normal mood and affect. Her behavior is normal.     Labs reviewed: Admission on 06/23/2015, Discharged on 06/24/2015  Component Date Value Ref Range Status  . WBC 06/23/2015 7.8  4.0 - 10.5 K/uL Final  . RBC 06/23/2015 3.19* 3.87 - 5.11 MIL/uL Final  . Hemoglobin 06/23/2015 9.0* 12.0 - 15.0 g/dL Final  . HCT 06/23/2015 28.4* 36.0 - 46.0 % Final  . MCV 06/23/2015 89.0  78.0 - 100.0 fL Final  . MCH 06/23/2015 28.2  26.0 - 34.0 pg Final  . MCHC 06/23/2015 31.7  30.0 - 36.0 g/dL Final  . RDW 06/23/2015 15.8* 11.5 - 15.5 % Final  . Platelets 06/23/2015 288  150 - 400 K/uL Final  . Neutrophils Relative % 06/23/2015 78* 43 - 77 % Final  . Neutro Abs 06/23/2015 6.0  1.7 - 7.7 K/uL Final  . Lymphocytes Relative 06/23/2015 9* 12 - 46 % Final  . Lymphs Abs 06/23/2015 0.7  0.7 - 4.0 K/uL Final  . Monocytes Relative 06/23/2015 11  3 - 12 % Final  . Monocytes Absolute 06/23/2015 0.9  0.1 - 1.0 K/uL Final  . Eosinophils Relative 06/23/2015 2  0 - 5 % Final  . Eosinophils Absolute 06/23/2015 0.1  0.0 - 0.7 K/uL Final  . Basophils Relative 06/23/2015 0  0 - 1 % Final  . Basophils Absolute 06/23/2015 0.0  0.0 - 0.1 K/uL Final  . Sodium 06/23/2015 139  135 - 145 mmol/L Final  . Potassium 06/23/2015 3.2* 3.5 - 5.1 mmol/L Final  . Chloride 06/23/2015 100* 101 - 111 mmol/L Final  . CO2 06/23/2015 31  22 - 32 mmol/L Final  . Glucose, Bld 06/23/2015 106* 65 - 99 mg/dL Final  . BUN 06/23/2015 22* 6 - 20 mg/dL Final  . Creatinine, Ser 06/23/2015 0.82  0.44 - 1.00 mg/dL Final  . Calcium 06/23/2015 8.6* 8.9 - 10.3 mg/dL Final  . Total Protein 06/23/2015 6.9  6.5 - 8.1 g/dL Final  . Albumin 06/23/2015 3.0* 3.5 - 5.0 g/dL Final  . AST 06/23/2015 25  15 - 41 U/L Final  . ALT 06/23/2015 17  14 - 54 U/L Final  . Alkaline Phosphatase 06/23/2015 67  38 - 126 U/L Final  . Total Bilirubin  06/23/2015 0.5  0.3 - 1.2 mg/dL Final  . GFR calc non Af Amer 06/23/2015 59* >60 mL/min Final  . GFR calc Af Wyvonnia Lora  06/23/2015 >60  >60 mL/min Final   Comment: (NOTE) The eGFR has been calculated using the CKD EPI equation. This calculation has not been validated in all clinical situations. eGFR's persistently <60 mL/min signify possible Chronic Kidney Disease.   . Anion gap 06/23/2015 8  5 - 15 Final  . Troponin i, poc 06/23/2015 0.04  0.00 - 0.08 ng/mL Final  . Comment 3 06/23/2015          Final   Comment: Due to the release kinetics of cTnI, a negative result within the first hours of the onset of symptoms does not rule out myocardial infarction with certainty. If myocardial infarction is still suspected, repeat the test at appropriate intervals.   . B Natriuretic Peptide 06/23/2015 356.3* 0.0 - 100.0 pg/mL Final  . Lactic Acid, Venous 06/23/2015 0.82  0.5 - 2.0 mmol/L Final  . Fecal Occult Bld 06/23/2015 NEGATIVE  NEGATIVE Final  . Specimen Description 06/23/2015 BLOOD LEFT HAND   Final  . Special Requests 06/23/2015 BOTTLES DRAWN AEROBIC ONLY 5ML   Final  . Culture 06/23/2015    Final                   Value:NO GROWTH 5 DAYS Performed at [facility]   . Report Status 06/23/2015 06/28/2015 FINAL   Final  . Specimen Description 06/23/2015 BLOOD RIGHT HAND   Final  . Special Requests 06/23/2015 BOTTLES DRAWN AEROBIC AND ANAEROBIC [place]   Final  . Culture 06/23/2015    Final                   Value:NO GROWTH 5 DAYS Performed at [facility]   . Report Status 06/23/2015 06/28/2015 FINAL   Final  . Specimen Description 06/23/2015 URINE, RANDOM   Final  . Special Requests 06/23/2015 NONE   Final  . Legionella Antigen, Urine 06/23/2015    Final                   Value:Negative for Legionella pneumophila serogroup 1                                                              Legionella pneumophila serogroup 1 antigen can be detected in urine within 2 to 3 days of  infection and may persist even after treatment. This  assay does not detect other Legionella species or serogroups. Performed at [facility]   . Report Status 06/23/2015 06/27/2015 FINAL   Final  . Strep Pneumo Urinary Antigen 06/23/2015 NEGATIVE  NEGATIVE Final   Comment:        Infection due to S. pneumoniae cannot be absolutely ruled out since the antigen present may be below the detection limit of the test. Performed at [facility]   . Sodium 06/23/2015 141  135 - 145 mmol/L Final  . Potassium 06/23/2015 3.2* 3.5 - 5.1 mmol/L Final  . Chloride 06/23/2015 99* 101 - 111 mmol/L Final  . CO2 06/23/2015 32  22 - 32 mmol/L Final  . Glucose, Bld 06/23/2015 126* 65 - 99 mg/dL Final  . BUN 06/23/2015 19  6 - 20 mg/dL Final  . Creatinine, Ser 06/23/2015 0.91  0.44 - 1.00 mg/dL Final  . Calcium 06/23/2015 8.5* 8.9 - 10.3 mg/dL Final  .  Total Protein 06/23/2015 6.7  6.5 - 8.1 g/dL Final  . Albumin 06/23/2015 2.8* 3.5 - 5.0 g/dL Final  . AST 06/23/2015 24  15 - 41 U/L Final  . ALT 06/23/2015 17  14 - 54 U/L Final  . Alkaline Phosphatase 06/23/2015 63  38 - 126 U/L Final  . Total Bilirubin 06/23/2015 0.5  0.3 - 1.2 mg/dL Final  . GFR calc non Af Amer 06/23/2015 52* >60 mL/min Final  . GFR calc Af Amer 06/23/2015 60* >60 mL/min Final   Comment: (NOTE) The eGFR has been calculated using the CKD EPI equation. This calculation has not been validated in all clinical situations. eGFR's persistently <60 mL/min signify possible Chronic Kidney Disease.   . Anion gap 06/23/2015 10  5 - 15 Final  . Magnesium 06/23/2015 1.7  1.7 - 2.4 mg/dL Final  . Phosphorus 06/23/2015 2.6  2.5 - 4.6 mg/dL Final  . WBC 06/23/2015 6.6  4.0 - 10.5 K/uL Final  . RBC 06/23/2015 3.28* 3.87 - 5.11 MIL/uL Final  . Hemoglobin 06/23/2015 9.1* 12.0 - 15.0 g/dL Final  . HCT 06/23/2015 29.1* 36.0 - 46.0 % Final  . MCV 06/23/2015 88.7  78.0 - 100.0 fL Final  . MCH 06/23/2015 27.7  26.0 - 34.0 pg Final  .  MCHC 06/23/2015 31.3  30.0 - 36.0 g/dL Final  . RDW 06/23/2015 15.5  11.5 - 15.5 % Final  . Platelets 06/23/2015 269  150 - 400 K/uL Final  . Neutrophils Relative % 06/23/2015 75  43 - 77 % Final  . Neutro Abs 06/23/2015 5.0  1.7 - 7.7 K/uL Final  . Lymphocytes Relative 06/23/2015 10* 12 - 46 % Final  . Lymphs Abs 06/23/2015 0.7  0.7 - 4.0 K/uL Final  . Monocytes Relative 06/23/2015 10  3 - 12 % Final  . Monocytes Absolute 06/23/2015 0.7  0.1 - 1.0 K/uL Final  . Eosinophils Relative 06/23/2015 5  0 - 5 % Final  . Eosinophils Absolute 06/23/2015 0.3  0.0 - 0.7 K/uL Final  . Basophils Relative 06/23/2015 0  0 - 1 % Final  . Basophils Absolute 06/23/2015 0.0  0.0 - 0.1 K/uL Final  . aPTT 06/23/2015 28  24 - 37 seconds Final  . Prothrombin Time 06/23/2015 14.4  11.6 - 15.2 seconds Final  . INR 06/23/2015 1.10  0.00 - 1.49 Final  . TSH 06/23/2015 1.440  0.350 - 4.500 uIU/mL Final  . Sodium 06/24/2015 141  135 - 145 mmol/L Final  . Potassium 06/24/2015 3.4* 3.5 - 5.1 mmol/L Final  . Chloride 06/24/2015 99* 101 - 111 mmol/L Final  . CO2 06/24/2015 32  22 - 32 mmol/L Final  . Glucose, Bld 06/24/2015 101* 65 - 99 mg/dL Final  . BUN 06/24/2015 25* 6 - 20 mg/dL Final  . Creatinine, Ser 06/24/2015 1.06* 0.44 - 1.00 mg/dL Final  . Calcium 06/24/2015 8.4* 8.9 - 10.3 mg/dL Final  . Total Protein 06/24/2015 6.3* 6.5 - 8.1 g/dL Final  . Albumin 06/24/2015 2.6* 3.5 - 5.0 g/dL Final  . AST 06/24/2015 23  15 - 41 U/L Final  . ALT 06/24/2015 17  14 - 54 U/L Final  . Alkaline Phosphatase 06/24/2015 62  38 - 126 U/L Final  . Total Bilirubin 06/24/2015 0.3  0.3 - 1.2 mg/dL Final  . GFR calc non Af Amer 06/24/2015 43* >60 mL/min Final  . GFR calc Af Amer 06/24/2015 50* >60 mL/min Final   Comment: (NOTE) The eGFR has been calculated  using the CKD EPI equation. This calculation has not been validated in all clinical situations. eGFR's persistently <60 mL/min signify possible Chronic Kidney Disease.   .  Anion gap 06/24/2015 10  5 - 15 Final  . WBC 06/24/2015 6.2  4.0 - 10.5 K/uL Final  . RBC 06/24/2015 3.02* 3.87 - 5.11 MIL/uL Final  . Hemoglobin 06/24/2015 8.5* 12.0 - 15.0 g/dL Final  . HCT 06/24/2015 26.8* 36.0 - 46.0 % Final  . MCV 06/24/2015 88.7  78.0 - 100.0 fL Final  . MCH 06/24/2015 28.1  26.0 - 34.0 pg Final  . MCHC 06/24/2015 31.7  30.0 - 36.0 g/dL Final  . RDW 06/24/2015 15.7* 11.5 - 15.5 % Final  . Platelets 06/24/2015 258  150 - 400 K/uL Final  . Glucose-Capillary 06/24/2015 88  65 - 99 mg/dL Final  . Comment 1 06/24/2015 Notify RN   Final    No results found.   Assessment/Plan   ICD-9-CM ICD-10-CM   1. Memory loss or impairment - failing to change as expected 780.93 R41.3 donepezil (ARICEPT) 10 MG tablet     AMBULATORY NON FORMULARY MEDICATION  2. Pain, joint, multiple sites due to arthritis 719.49 M25.50 AMBULATORY NON FORMULARY MEDICATION  3. Physical deconditioning - improved 799.3 R53.81 AMBULATORY NON FORMULARY MEDICATION  4. Localized edema - stable 782.3 R60.0 AMBULATORY NON FORMULARY MEDICATION    Increase aricept $RemoveBeforeDE'10mg'[id number]$  at bedtime  Continue other medications as ordered  Handicap placard completed  Rx written for transport chair  Follow up in 3 mos for routine visit Roux Brandy S. Perlie Gold  [facility] [address] [place], [place] [address] [phone] Cell (Monday-Friday 8 AM - 5 PM) [phone] After 5 PM and follow prompts

## 2015-09-26 ENCOUNTER — Other Ambulatory Visit: Payer: Self-pay | Admitting: Internal Medicine

## 2015-10-03 ENCOUNTER — Other Ambulatory Visit: Payer: Self-pay | Admitting: *Deleted

## 2015-10-03 MED ORDER — POTASSIUM CHLORIDE ER 10 MEQ PO TBCR: 10.0000 meq | EXTENDED_RELEASE_TABLET | Freq: Every day | ORAL | Status: DC

## 2015-10-03 NOTE — Telephone Encounter (Signed)
[facility] [address] 

## 2015-10-31 ENCOUNTER — Other Ambulatory Visit: Payer: Self-pay | Admitting: Internal Medicine

## 2015-12-02 ENCOUNTER — Ambulatory Visit: Payer: Medicare Other | Admitting: Internal Medicine

## 2015-12-23 ENCOUNTER — Other Ambulatory Visit: Payer: Self-pay | Admitting: *Deleted

## 2015-12-23 MED ORDER — CLONIDINE HCL 0.1 MG PO TABS: 0.1000 mg | ORAL_TABLET | Freq: Every day | ORAL | Status: DC

## 2015-12-23 NOTE — Telephone Encounter (Signed)
Daughter requested and will make a follow up appointment.

## 2016-01-13 ENCOUNTER — Other Ambulatory Visit: Payer: Self-pay | Admitting: Internal Medicine

## 2016-01-19 ENCOUNTER — Other Ambulatory Visit: Payer: Self-pay | Admitting: Internal Medicine

## 2016-01-30 ENCOUNTER — Other Ambulatory Visit: Payer: Self-pay | Admitting: Internal Medicine

## 2016-02-19 ENCOUNTER — Other Ambulatory Visit: Payer: Self-pay | Admitting: Internal Medicine

## 2016-03-21 ENCOUNTER — Other Ambulatory Visit: Payer: Self-pay | Admitting: Internal Medicine

## 2016-04-18 ENCOUNTER — Other Ambulatory Visit: Payer: Self-pay | Admitting: Internal Medicine

## 2016-05-03 ENCOUNTER — Other Ambulatory Visit: Payer: Self-pay | Admitting: Internal Medicine

## 2016-05-18 IMAGING — CR DG FOREARM 2V*R*
1 series · 1 of 1 positions shown · non-contrast
Comparison: None.

CLINICAL DATA: [AGE] who fell 4 days ago and injured the
right upper extremity. Patient is unable to point to a specific area
of pain. Initial encounter.

EXAM:
RIGHT FOREARM - 2 VIEW

[view not recorded]
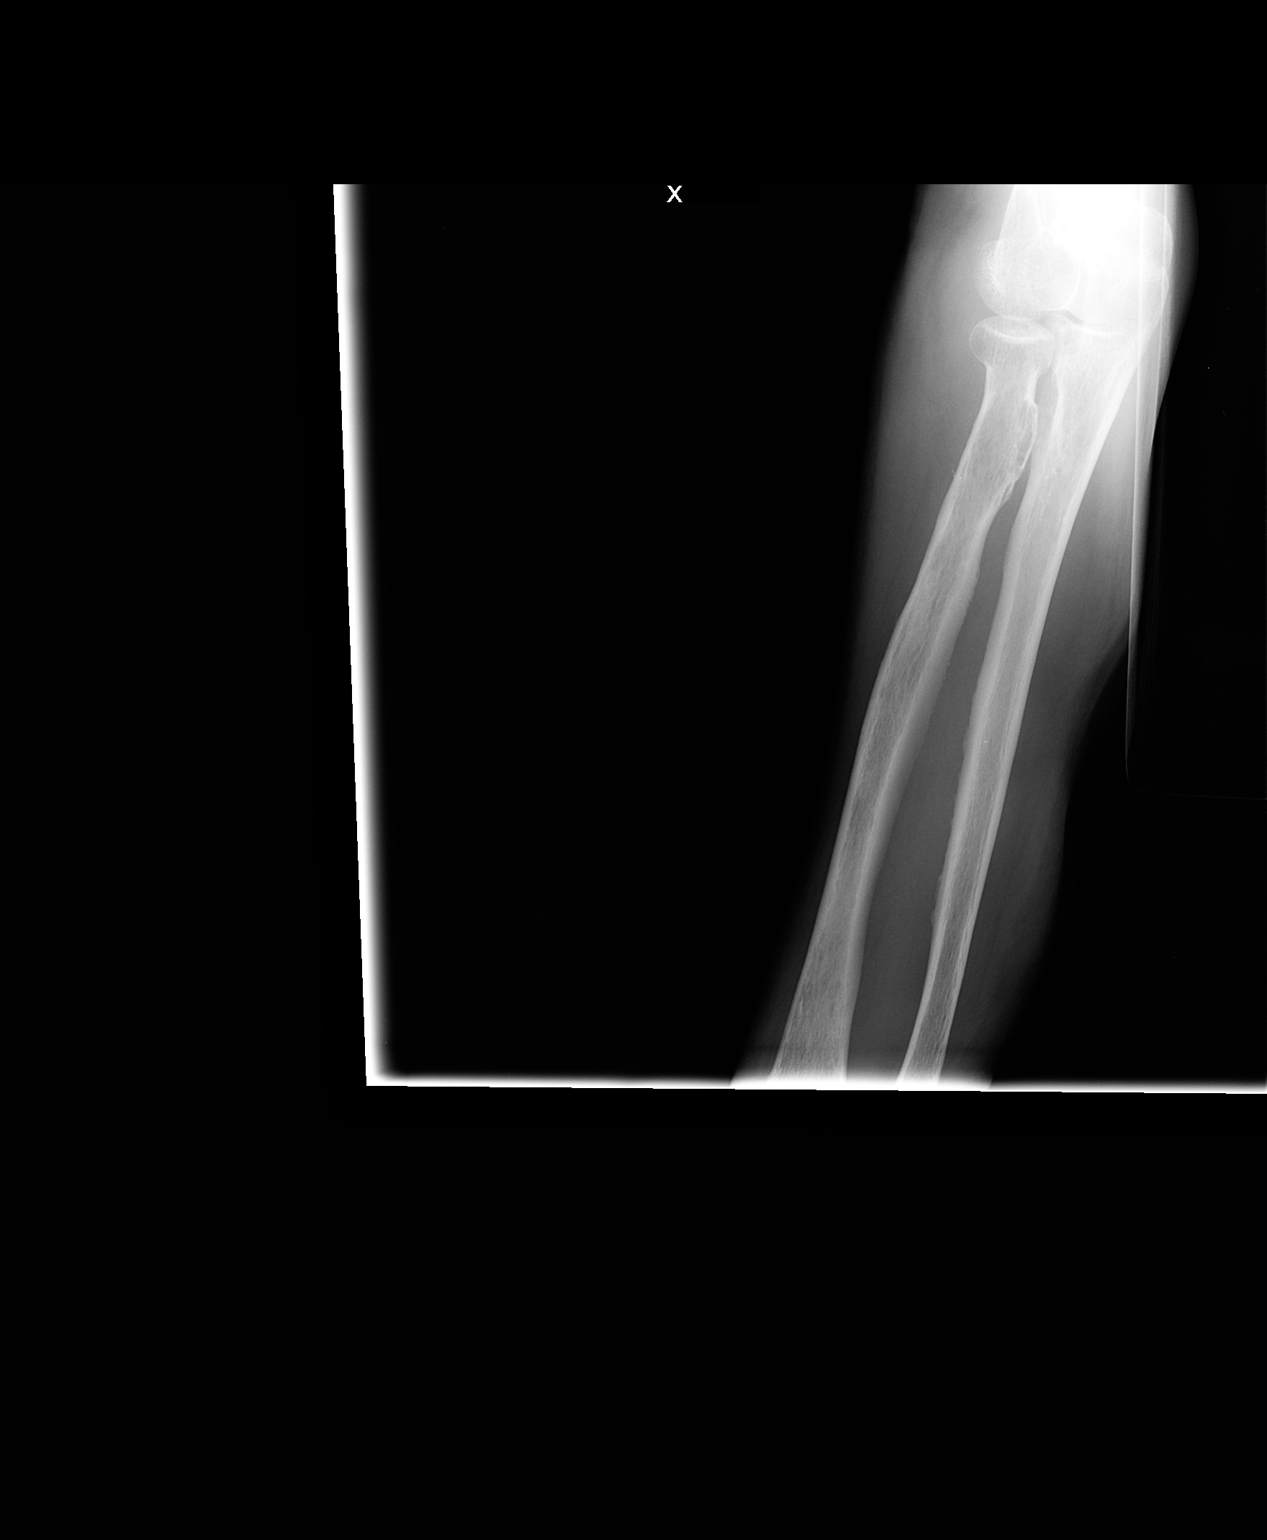

[1 of 1 positions shown; findings below may reference images not displayed]

FINDINGS: No evidence of acute fracture involving the radius or ulna. Mild
osseous demineralization. Soft tissue swelling overlying the
proximal ulna.
IMPRESSION: No acute osseous abnormality.

## 2016-05-25 ENCOUNTER — Ambulatory Visit (INDEPENDENT_AMBULATORY_CARE_PROVIDER_SITE_OTHER): Payer: Medicare Other | Admitting: Internal Medicine

## 2016-05-25 ENCOUNTER — Encounter: Payer: Self-pay | Admitting: Internal Medicine

## 2016-05-25 VITALS — BP 130/64 | HR 52 | Temp 98.3°F | Ht 64.0 in | Wt 140.0 lb

## 2016-05-25 DIAGNOSIS — R2681 Unsteadiness on feet: Secondary | ICD-10-CM | POA: Diagnosis not present

## 2016-05-25 DIAGNOSIS — N189 Chronic kidney disease, unspecified: Secondary | ICD-10-CM | POA: Diagnosis not present

## 2016-05-25 DIAGNOSIS — M2550 Pain in unspecified joint: Secondary | ICD-10-CM | POA: Diagnosis not present

## 2016-05-25 DIAGNOSIS — R413 Other amnesia: Secondary | ICD-10-CM | POA: Diagnosis not present

## 2016-05-25 DIAGNOSIS — I509 Heart failure, unspecified: Secondary | ICD-10-CM | POA: Diagnosis not present

## 2016-05-25 DIAGNOSIS — R600 Localized edema: Secondary | ICD-10-CM | POA: Diagnosis not present

## 2016-05-25 DIAGNOSIS — N183 Chronic kidney disease, stage 3 unspecified: Secondary | ICD-10-CM

## 2016-05-25 DIAGNOSIS — D631 Anemia in chronic kidney disease: Secondary | ICD-10-CM | POA: Diagnosis not present

## 2016-05-25 DIAGNOSIS — I10 Essential (primary) hypertension: Secondary | ICD-10-CM | POA: Diagnosis not present

## 2016-05-25 DIAGNOSIS — R296 Repeated falls: Secondary | ICD-10-CM

## 2016-05-25 DIAGNOSIS — R001 Bradycardia, unspecified: Secondary | ICD-10-CM

## 2016-05-25 LAB — CBC WITH DIFFERENTIAL/PLATELET
BASOS ABS: 38 {cells}/uL (ref 0–200)
Basophils Relative: 1 %
EOS ABS: 152 {cells}/uL (ref 15–500)
EOS PCT: 4 %
HEMATOCRIT: 35.8 % (ref 35.0–45.0)
HEMOGLOBIN: 11.3 g/dL — AB (ref 11.7–15.5)
LYMPHS ABS: 1330 {cells}/uL (ref 850–3900)
Lymphocytes Relative: 35 %
MCH: 29.4 pg (ref 27.0–33.0)
MCHC: 31.6 g/dL — AB (ref 32.0–36.0)
MCV: 93.2 fL (ref 80.0–100.0)
MPV: 11.1 fL (ref 7.5–12.5)
Monocytes Absolute: 418 cells/uL (ref 200–950)
Monocytes Relative: 11 %
NEUTROS PCT: 49 %
Neutro Abs: 1862 cells/uL (ref 1500–7800)
Platelets: 159 10*3/uL (ref 140–400)
RBC: 3.84 MIL/uL (ref 3.80–5.10)
RDW: 15.3 % — ABNORMAL HIGH (ref 11.0–15.0)
WBC: 3.8 10*3/uL (ref 3.8–10.8)

## 2016-05-25 LAB — TSH: TSH: 1.86 m[IU]/L

## 2016-05-25 LAB — COMPLETE METABOLIC PANEL WITH GFR
ALBUMIN: 4 g/dL (ref 3.6–5.1)
ALT: 11 U/L (ref 6–29)
AST: 28 U/L (ref 10–35)
Alkaline Phosphatase: 61 U/L (ref 33–130)
BUN: 28 mg/dL — AB (ref 7–25)
CALCIUM: 8.9 mg/dL (ref 8.6–10.4)
CHLORIDE: 99 mmol/L (ref 98–110)
CO2: 32 mmol/L — ABNORMAL HIGH (ref 20–31)
CREATININE: 1.14 mg/dL — AB (ref 0.60–0.88)
GFR, Est African American: 47 mL/min — ABNORMAL LOW (ref >=60)
GFR, Est Non African American: 40 mL/min — ABNORMAL LOW (ref >=60)
GLUCOSE: 78 mg/dL (ref 65–99)
Potassium: 3.9 mmol/L (ref 3.5–5.3)
SODIUM: 142 mmol/L (ref 135–146)
Total Bilirubin: 0.4 mg/dL (ref 0.2–1.2)
Total Protein: 7 g/dL (ref 6.1–8.1)

## 2016-05-25 NOTE — Progress Notes (Signed)
Patient ID: Rachel Hickman, female   DOB: 06-08-1917, 82 y.o.   MRN: [id number]    Location:  [facility] Place of Service: OFFICE  Chief Complaint  Patient presents with  . Medical Management of Chronic Issues    9 month follow-up     HPI:  82 yo female seen today for f/u. Daughter c/a pulse - HR 52 today. She is followed by cardio Dr Oval Linsey but she has not been seen since 07/2015.  She takes clonidine and metoprolol for HTN. No labs since Sept 2016. No CP, SOB, HA, dizziness, palpitations.  Joint pain/arthritis - she has joint stiffness. She walks as much as she can and uses walker at home. She has fallen x 2-3 since last visit in 2016. She lost her balance each time and was away from her walker  Dementia - she has gained 5 lbs since last OV. Appetite ok. She did not tolerate aricept and daughter does not recall reaction; only that she has not taken it "in a long time"  LE edema - stable- keeps legs elevated when seated. Uses TED stockings daily  CHF hx - stable on lasix. Followed by cardiology  Past Medical History:  Diagnosis Date  . Acute kidney injury ([place])   . Arthritis   . Bradycardia   . Confusion   . Disorder of pineal gland   . Hypertension     Past Surgical History:  Procedure Laterality Date  . CESAREAN SECTION      Patient Care Team: Gildardo Cranker, DO as PCP - General (Internal Medicine)  Social History   Social History  . Marital status: Widowed    Spouse name: N/A  . Number of children: N/A  . Years of education: N/A   Occupational History  . Not on file.   Social History Main Topics  . Smoking status: Never Smoker  . Smokeless tobacco: Never Used  . Alcohol use No  . Drug use: No  . Sexual activity: Not on file   Other Topics Concern  . Not on file   Social History Narrative   Diet- Yes, Healthy heart   Caffeine- Yes   Married- 1946, Widowed   House- Yes, moved in with daughter (11/30/14)   Pets- No   Current/past profession-   Exercise-  Infrequently   Living will- No   DNR-No   POA/HPOA-No           reports that she has never smoked. She has never used smokeless tobacco. She reports that she does not drink alcohol or use drugs.  Family History  Problem Relation Age of Onset  . Cancer Brother   . Cancer Brother   . Cancer Mother   . Diabetes Brother   . Hypertension     Family Status  Relation Status  . Brother Deceased  . Brother Deceased  . Mother Deceased  . Brother Alive  . Father Deceased  . Brother Deceased  . Brother Deceased  . Brother Deceased  . Brother Deceased  . Sister Deceased  . Sister Alive  . Daughter Deceased   MVA  . Daughter Alive  . Maternal Grandmother Deceased  . Maternal Grandfather Deceased  . Paternal Grandmother Deceased  . Paternal Grandfather Deceased  .       No Known Allergies  Medications: Patient's Medications  New Prescriptions   No medications on file  Previous Medications   AMBULATORY NON FORMULARY MEDICATION    Transport wheelchair with leg rests   ASPIRIN 81  MG TABLET    Take 81 mg by mouth daily.   CLONIDINE (CATAPRES) 0.1 MG TABLET    Take 0.1 mg by mouth daily.   FEEDING SUPPLEMENT, ENSURE COMPLETE, (ENSURE COMPLETE) LIQD    Take 237 mLs by mouth 2 (two) times daily between meals.   FUROSEMIDE (LASIX) 40 MG TABLET    Take 1 tablet (40 mg total) by mouth daily. Take an extra tablet as needed for swelling.   KLOR-CON 10 10 MEQ TABLET    TAKE 1 TABLET BY MOUTH EVERY DAY   METOPROLOL TARTRATE (LOPRESSOR) 25 MG TABLET    TAKE ONE TABLET BY MOUTH ONCE DAILY FOR BLOOD PRESSURE  Modified Medications   No medications on file  Discontinued Medications   CLONIDINE (CATAPRES) 0.1 MG TABLET    TAKE 1 TABLET (0.1 MG TOTAL) BY MOUTH DAILY. PATIENTS NEEDS TO MAKE APPOINTMENT   DONEPEZIL (ARICEPT) 10 MG TABLET    Take 1 tablet (10 mg total) by mouth at bedtime.   DONEPEZIL (ARICEPT) 5 MG TABLET    TAKE 1 TABLET BY MOUTH AT BEDTIME    Review of Systems  Unable to  perform ROS: Dementia    Vitals:   05/25/16 1413  BP: 130/64  Pulse: (!) 52  Temp: 98.3 F (36.8 C)  TempSrc: Oral  SpO2: 95%  Weight: 140 lb (63.5 kg)  Height: '5\' 4"'$  (1.626 m)   Body mass index is 24.03 kg/m.  Physical Exam  Constitutional: She appears well-developed. No distress.  Frail appearing in NAD, sitting in w/c  Cardiovascular: Regular rhythm.  Frequent extrasystoles are present. Bradycardia present.  Exam reveals no gallop and no friction rub.   Murmur heard.  Systolic murmur is present with a grade of 1/6  +1 pitting LE edema b/l. No calf TTP. TED stockings intact b/l  Pulmonary/Chest: Effort normal and breath sounds normal. No respiratory distress. She has no wheezes. She has no rales. She exhibits no tenderness.  Musculoskeletal: She exhibits edema and tenderness.  Tartaglia and large joint swelling and deformities  Neurological: She is alert.  Skin: Skin is warm and dry. No rash noted.  Psychiatric: She has a normal mood and affect. Her behavior is normal.     Labs reviewed: No visits with results within 3 Month(s) from this visit.  Latest known visit with results is:  Admission on 06/23/2015, Discharged on 06/24/2015  Component Date Value Ref Range Status  . WBC 06/23/2015 7.8  4.0 - 10.5 K/uL Final  . RBC 06/23/2015 3.19* 3.87 - 5.11 MIL/uL Final  . Hemoglobin 06/23/2015 9.0* 12.0 - 15.0 g/dL Final  . HCT 06/23/2015 28.4* 36.0 - 46.0 % Final  . MCV 06/23/2015 89.0  78.0 - 100.0 fL Final  . MCH 06/23/2015 28.2  26.0 - 34.0 pg Final  . MCHC 06/23/2015 31.7  30.0 - 36.0 g/dL Final  . RDW 06/23/2015 15.8* 11.5 - 15.5 % Final  . Platelets 06/23/2015 288  150 - 400 K/uL Final  . Neutrophils Relative % 06/23/2015 78* 43 - 77 % Final  . Neutro Abs 06/23/2015 6.0  1.7 - 7.7 K/uL Final  . Lymphocytes Relative 06/23/2015 9* 12 - 46 % Final  . Lymphs Abs 06/23/2015 0.7  0.7 - 4.0 K/uL Final  . Monocytes Relative 06/23/2015 11  3 - 12 % Final  . Monocytes Absolute  06/23/2015 0.9  0.1 - 1.0 K/uL Final  . Eosinophils Relative 06/23/2015 2  0 - 5 % Final  . Eosinophils Absolute 06/23/2015  0.1  0.0 - 0.7 K/uL Final  . Basophils Relative 06/23/2015 0  0 - 1 % Final  . Basophils Absolute 06/23/2015 0.0  0.0 - 0.1 K/uL Final  . Sodium 06/23/2015 139  135 - 145 mmol/L Final  . Potassium 06/23/2015 3.2* 3.5 - 5.1 mmol/L Final  . Chloride 06/23/2015 100* 101 - 111 mmol/L Final  . CO2 06/23/2015 31  22 - 32 mmol/L Final  . Glucose, Bld 06/23/2015 106* 65 - 99 mg/dL Final  . BUN 06/23/2015 22* 6 - 20 mg/dL Final  . Creatinine, Ser 06/23/2015 0.82  0.44 - 1.00 mg/dL Final  . Calcium 06/23/2015 8.6* 8.9 - 10.3 mg/dL Final  . Total Protein 06/23/2015 6.9  6.5 - 8.1 g/dL Final  . Albumin 06/23/2015 3.0* 3.5 - 5.0 g/dL Final  . AST 06/23/2015 25  15 - 41 U/L Final  . ALT 06/23/2015 17  14 - 54 U/L Final  . Alkaline Phosphatase 06/23/2015 67  38 - 126 U/L Final  . Total Bilirubin 06/23/2015 0.5  0.3 - 1.2 mg/dL Final  . GFR calc non Af Amer 06/23/2015 59* >60 mL/min Final  . GFR calc Af Amer 06/23/2015 >60  >60 mL/min Final   Comment: (NOTE) The eGFR has been calculated using the CKD EPI equation. This calculation has not been validated in all clinical situations. eGFR's persistently <60 mL/min signify possible Chronic Kidney Disease.   . Anion gap 06/23/2015 8  5 - 15 Final  . Troponin i, poc 06/23/2015 0.04  0.00 - 0.08 ng/mL Final  . Comment 3 06/23/2015          Final   Comment: Due to the release kinetics of cTnI, a negative result within the first hours of the onset of symptoms does not rule out myocardial infarction with certainty. If myocardial infarction is still suspected, repeat the test at appropriate intervals.   . B Natriuretic Peptide 06/23/2015 356.3* 0.0 - 100.0 pg/mL Final  . Lactic Acid, Venous 06/23/2015 0.82  0.5 - 2.0 mmol/L Final  . Fecal Occult Bld 06/23/2015 NEGATIVE  NEGATIVE Final  . Specimen Description 06/28/2015 BLOOD LEFT  HAND   Final  . Special Requests 06/28/2015 BOTTLES DRAWN AEROBIC ONLY 5ML   Final  . Culture 06/28/2015    Final                   Value:NO GROWTH 5 DAYS Performed at [facility]   . Report Status 06/28/2015 06/28/2015 FINAL   Final  . Specimen Description 06/28/2015 BLOOD RIGHT HAND   Final  . Special Requests 06/28/2015 BOTTLES DRAWN AEROBIC AND ANAEROBIC [place]   Final  . Culture 06/28/2015    Final                   Value:NO GROWTH 5 DAYS Performed at [facility]   . Report Status 06/28/2015 06/28/2015 FINAL   Final  . Specimen Description 06/27/2015 URINE, RANDOM   Final  . Special Requests 06/27/2015 NONE   Final  . Legionella Antigen, Urine 06/27/2015    Final                   Value:Negative for Legionella pneumophila serogroup 1  Legionella pneumophila serogroup 1 antigen can be detected in urine within 2 to 3 days of infection and may persist even after treatment. This  assay does not detect other Legionella species or serogroups. Performed at [facility]   . Report Status 06/27/2015 06/27/2015 FINAL   Final  . Strep Pneumo Urinary Antigen 06/24/2015 NEGATIVE  NEGATIVE Final   Comment:        Infection due to S. pneumoniae cannot be absolutely ruled out since the antigen present may be below the detection limit of the test. Performed at [facility]   . Sodium 06/23/2015 141  135 - 145 mmol/L Final  . Potassium 06/23/2015 3.2* 3.5 - 5.1 mmol/L Final  . Chloride 06/23/2015 99* 101 - 111 mmol/L Final  . CO2 06/23/2015 32  22 - 32 mmol/L Final  . Glucose, Bld 06/23/2015 126* 65 - 99 mg/dL Final  . BUN 06/23/2015 19  6 - 20 mg/dL Final  . Creatinine, Ser 06/23/2015 0.91  0.44 - 1.00 mg/dL Final  . Calcium 06/23/2015 8.5* 8.9 - 10.3 mg/dL Final  . Total Protein 06/23/2015 6.7  6.5 - 8.1 g/dL Final  . Albumin 06/23/2015 2.8* 3.5 - 5.0 g/dL Final  . AST 06/23/2015 24  15 - 41 U/L  Final  . ALT 06/23/2015 17  14 - 54 U/L Final  . Alkaline Phosphatase 06/23/2015 63  38 - 126 U/L Final  . Total Bilirubin 06/23/2015 0.5  0.3 - 1.2 mg/dL Final  . GFR calc non Af Amer 06/23/2015 52* >60 mL/min Final  . GFR calc Af Amer 06/23/2015 60* >60 mL/min Final   Comment: (NOTE) The eGFR has been calculated using the CKD EPI equation. This calculation has not been validated in all clinical situations. eGFR's persistently <60 mL/min signify possible Chronic Kidney Disease.   . Anion gap 06/23/2015 10  5 - 15 Final  . Magnesium 06/23/2015 1.7  1.7 - 2.4 mg/dL Final  . Phosphorus 06/23/2015 2.6  2.5 - 4.6 mg/dL Final  . WBC 06/23/2015 6.6  4.0 - 10.5 K/uL Final  . RBC 06/23/2015 3.28* 3.87 - 5.11 MIL/uL Final  . Hemoglobin 06/23/2015 9.1* 12.0 - 15.0 g/dL Final  . HCT 06/23/2015 29.1* 36.0 - 46.0 % Final  . MCV 06/23/2015 88.7  78.0 - 100.0 fL Final  . MCH 06/23/2015 27.7  26.0 - 34.0 pg Final  . MCHC 06/23/2015 31.3  30.0 - 36.0 g/dL Final  . RDW 06/23/2015 15.5  11.5 - 15.5 % Final  . Platelets 06/23/2015 269  150 - 400 K/uL Final  . Neutrophils Relative % 06/23/2015 75  43 - 77 % Final  . Neutro Abs 06/23/2015 5.0  1.7 - 7.7 K/uL Final  . Lymphocytes Relative 06/23/2015 10* 12 - 46 % Final  . Lymphs Abs 06/23/2015 0.7  0.7 - 4.0 K/uL Final  . Monocytes Relative 06/23/2015 10  3 - 12 % Final  . Monocytes Absolute 06/23/2015 0.7  0.1 - 1.0 K/uL Final  . Eosinophils Relative 06/23/2015 5  0 - 5 % Final  . Eosinophils Absolute 06/23/2015 0.3  0.0 - 0.7 K/uL Final  . Basophils Relative 06/23/2015 0  0 - 1 % Final  . Basophils Absolute 06/23/2015 0.0  0.0 - 0.1 K/uL Final  . aPTT 06/23/2015 28  24 - 37 seconds Final  . Prothrombin Time 06/23/2015 14.4  11.6 - 15.2 seconds Final  . INR 06/23/2015 1.10  0.00 - 1.49 Final  . TSH 06/23/2015 1.440  0.350 - 4.500 uIU/mL Final  . Sodium 06/24/2015 141  135 - 145 mmol/L Final  . Potassium 06/24/2015 3.4* 3.5 - 5.1 mmol/L Final  .  Chloride 06/24/2015 99* 101 - 111 mmol/L Final  . CO2 06/24/2015 32  22 - 32 mmol/L Final  . Glucose, Bld 06/24/2015 101* 65 - 99 mg/dL Final  . BUN 06/24/2015 25* 6 - 20 mg/dL Final  . Creatinine, Ser 06/24/2015 1.06* 0.44 - 1.00 mg/dL Final  . Calcium 06/24/2015 8.4* 8.9 - 10.3 mg/dL Final  . Total Protein 06/24/2015 6.3* 6.5 - 8.1 g/dL Final  . Albumin 06/24/2015 2.6* 3.5 - 5.0 g/dL Final  . AST 06/24/2015 23  15 - 41 U/L Final  . ALT 06/24/2015 17  14 - 54 U/L Final  . Alkaline Phosphatase 06/24/2015 62  38 - 126 U/L Final  . Total Bilirubin 06/24/2015 0.3  0.3 - 1.2 mg/dL Final  . GFR calc non Af Amer 06/24/2015 43* >60 mL/min Final  . GFR calc Af Amer 06/24/2015 50* >60 mL/min Final   Comment: (NOTE) The eGFR has been calculated using the CKD EPI equation. This calculation has not been validated in all clinical situations. eGFR's persistently <60 mL/min signify possible Chronic Kidney Disease.   . Anion gap 06/24/2015 10  5 - 15 Final  . WBC 06/24/2015 6.2  4.0 - 10.5 K/uL Final  . RBC 06/24/2015 3.02* 3.87 - 5.11 MIL/uL Final  . Hemoglobin 06/24/2015 8.5* 12.0 - 15.0 g/dL Final  . HCT 06/24/2015 26.8* 36.0 - 46.0 % Final  . MCV 06/24/2015 88.7  78.0 - 100.0 fL Final  . MCH 06/24/2015 28.1  26.0 - 34.0 pg Final  . MCHC 06/24/2015 31.7  30.0 - 36.0 g/dL Final  . RDW 06/24/2015 15.7* 11.5 - 15.5 % Final  . Platelets 06/24/2015 258  150 - 400 K/uL Final  . Glucose-Capillary 06/24/2015 88  65 - 99 mg/dL Final  . Comment 1 06/24/2015 Notify RN   Final    No results found. ECG OBTAINED AND REVIEWED BY MYSELF: SB @ 42 bpm, nml axis, LAE, NSST changes, poor R wave progression; other than rate, no acute ischemic changes compare to 07/2015 ECG  Assessment/Plan   ICD-9-CM ICD-10-CM   1. Bradycardia 427.89 R00.1 Ambulatory referral to Cardiology     CMP with eGFR     TSH     Urinalysis with Reflex Microscopic  2. Memory loss or impairment 780.93 R41.3 CMP with eGFR  3. Pain,  joint, multiple sites 719.49 M25.50   4. Bilateral leg edema 782.3 R60.0 Ambulatory referral to Cardiology     Urinalysis with Reflex Microscopic  5. Essential hypertension, benign 401.1 I10 TSH     Urinalysis with Reflex Microscopic  6. Frequent falls V15.88 R29.6 TSH     Urinalysis with Reflex Microscopic  7. Unsteady gait 781.2 R26.81 TSH     Urinalysis with Reflex Microscopic  8. Chronic congestive heart failure, unspecified congestive heart failure type ([place]) 428.0 I50.9 Ambulatory referral to Cardiology  9. Chronic renal insufficiency, stage III (moderate) 585.3 N18.9 CMP with eGFR  10. Anemia in chronic kidney disease 285.21 N18.9 CBC with Differential/Platelets   585.9 D63.1     Check labs  Refer to cardio for f/u  Reduce metoprolol '25mg'$  1 tab  in the morning and 1/2 tab in the evening  Continue other medications as ordered  Will call with lab results  Use walker when ambulating  Follow up with cardiology for routine visit  Follow up in 3 mos for routine visit   Carrigan Delafuente S. Perlie Gold  [facility] [address] [place], [place] [address] [phone] Cell (Monday-Friday 8 AM - 5 PM) [phone] After 5 PM and follow prompts

## 2016-05-25 NOTE — Patient Instructions (Addendum)
Reduce metoprolol 25mg  1 tab  in the morning and 1/2 tab in the evening  Continue other medications as ordered  Will call with lab results  Use walker when ambulating  Follow up with cardiology for routine visit  Follow up in 3 mos for routine visit

## 2016-05-26 LAB — URINALYSIS, ROUTINE W REFLEX MICROSCOPIC
BILIRUBIN URINE: NEGATIVE
GLUCOSE, UA: NEGATIVE
Hgb urine dipstick: NEGATIVE
KETONES UR: NEGATIVE
Leukocytes, UA: NEGATIVE
Nitrite: NEGATIVE
PH: 6.5 (ref 5.0–8.0)
Protein, ur: NEGATIVE
SPECIFIC GRAVITY, URINE: 1.009 (ref 1.001–1.035)

## 2016-05-29 ENCOUNTER — Telehealth: Payer: Self-pay | Admitting: *Deleted

## 2016-05-29 MED ORDER — METOPROLOL TARTRATE 25 MG PO TABS: ORAL_TABLET | ORAL | 1 refills | Status: DC

## 2016-05-29 NOTE — Telephone Encounter (Signed)
Patient daughter notified and agreed.  Medication list updated.  

## 2016-05-29 NOTE — Telephone Encounter (Signed)
Reduce to 1/2 tab daily 

## 2016-05-29 NOTE — Telephone Encounter (Signed)
Patient daughter, Sharia ReeveLavon called and stated that patient was seen on 05/25/2016 and you reduced her Metoprolol due to low heart rate. She needs medication clarification because you told her you were decreasing the Metoprolol to 25mg  one tablet in the morning and 1/2 tablet in the evening, but patient was taking Metoprolol 25mg  Once daily. Daughter states that your new directions is increasing the medication not decreasing. Please Advise.

## 2016-07-02 ENCOUNTER — Ambulatory Visit: Payer: Medicare Other | Admitting: Physician Assistant

## 2016-07-13 ENCOUNTER — Encounter: Payer: Self-pay | Admitting: Physician Assistant

## 2016-07-15 ENCOUNTER — Other Ambulatory Visit: Payer: Self-pay | Admitting: Internal Medicine

## 2016-07-16 ENCOUNTER — Ambulatory Visit (INDEPENDENT_AMBULATORY_CARE_PROVIDER_SITE_OTHER): Payer: Medicare Other | Admitting: Physician Assistant

## 2016-07-16 VITALS — BP 156/74 | HR 54 | Ht 62.0 in | Wt 146.0 lb

## 2016-07-16 DIAGNOSIS — I10 Essential (primary) hypertension: Secondary | ICD-10-CM | POA: Diagnosis not present

## 2016-07-16 DIAGNOSIS — I5032 Chronic diastolic (congestive) heart failure: Secondary | ICD-10-CM | POA: Diagnosis not present

## 2016-07-16 DIAGNOSIS — R001 Bradycardia, unspecified: Secondary | ICD-10-CM

## 2016-07-16 MED ORDER — AMLODIPINE BESYLATE 5 MG PO TABS: 5.0000 mg | ORAL_TABLET | Freq: Every day | ORAL | 3 refills | Status: DC

## 2016-07-16 NOTE — Patient Instructions (Signed)
Medications:  Start Amlodipine 5 mg--take 1/2 tab daily for 3 days. Then take 1 tab daily.   Other Instructions:  --Try to wear compression stockings again--non-prescription is ok.  --Your physician recommends that you weigh, daily, at the same time every day, and in the same amount of clothing. Please record your daily weights on the handout provided and bring it to your next appointment. OK to take 1 extra Lasix tablet daily as needed for swelling.   Follow-Up:  Your physician recommends that you schedule a follow-up appointment in: 2 months with Dr. Duke Salviaandolph.  If you need a refill on your cardiac medications before your next appointment, please call your pharmacy.

## 2016-07-16 NOTE — Progress Notes (Signed)
Cardiology Office Note   Date:  07/16/2016   ID:  Rachel RoesLois D Hickman, DOB 02-23-1917, MRN [id number]  PCP:  Kirt Boysarter, Monica, DO  Cardiologist:  Dr Verne Carrowandolph  Ashawna Hanback, PA-C   No chief complaint on file.   History of Present Illness: Rachel Hickman is a 82 y.o. female with a history of HTN, mod TR/MR, bradycardia, LE edema on TED hose.  08/08, Dr Montez Moritaarter reduced metoprolol to one half tab daily for bradycardia. To see cards for this and for lower extremity edema.   Rachel Hickman presents for assessment and management of her diastolic heart failure.  She has not been weighing herself regularly. She is very frail and is able to do some things for herself such as getting dressed in the morning. However, her activity is significantly limited by her dyspnea on exertion and general weakness. She uses a wheelchair whenever she leaves the house. She rarely leaves the house except for doctor's appointments.  She is not aware that her heart rate is low. She is not having lightheaded or dizzy spells. She gets a little dizzy when she has been over her periods of time and then stands up. She will also have mild chest pain whenever she works been over aureus active. She has taken nitroglycerin in the past for this pain and has been active. She has not had to take multiple nitroglycerin tablets.  She has edema during the day. The compression stockings are not prescription strength, but they are hard to get on and off. She has not been wearing them regularly. She does not know if she wakes up in the morning with lower extremity edema. Her dyspnea on exertion is chronic and she does not think it has changed recently.   Past Medical History:  Diagnosis Date  . Acute kidney injury ([facility])   . Arthritis   . Bradycardia   . Confusion   . Disorder of pineal gland   . Hypertension     Past Surgical History:  Procedure Laterality Date  . CESAREAN SECTION      Current Outpatient Prescriptions    Medication Sig Dispense Refill  . AMBULATORY NON FORMULARY MEDICATION Transport wheelchair with leg rests 1 each 0  . aspirin 81 MG tablet Take 81 mg by mouth daily.    . cloNIDine (CATAPRES) 0.1 MG tablet Take 0.1 mg by mouth daily.    . feeding supplement, ENSURE COMPLETE, (ENSURE COMPLETE) LIQD Take 237 mLs by mouth 2 (two) times daily between meals.    . furosemide (LASIX) 40 MG tablet Take 1 tablet (40 mg total) by mouth daily. Take an extra tablet as needed for swelling. 60 tablet 11  . KLOR-CON 10 10 MEQ tablet TAKE 1 TABLET BY MOUTH EVERY DAY 30 tablet 2  . metoprolol tartrate (LOPRESSOR) 25 MG tablet TAKE ONE TABLET BY MOUTH ONCE DAILY FOR BLOOD PRESSURE 90 tablet 1  . amLODipine (NORVASC) 5 MG tablet Take 1 tablet (5 mg total) by mouth daily. 180 tablet 3   No current facility-administered medications for this visit.     Allergies:   Review of patient's allergies indicates no known allergies.    Social History:  The patient  reports that she has never smoked. She has never used smokeless tobacco. She reports that she does not drink alcohol or use drugs.   Family History:  The patient's family history includes Cancer in her brother, brother, and mother; Diabetes in her brother.    ROS:  Please see the history of present illness. All other systems are reviewed and negative.    PHYSICAL EXAM: VS:  BP (!) 156/74   Pulse (!) 54   Ht 5\' 2"  (1.575 m)   Wt 146 lb (66.2 kg)   BMI 26.70 kg/m  , BMI Body mass index is 26.7 kg/m. GEN: Well nourished, well developed, female in no acute distress  HEENT: normal for age  Neck: JVD 8 cm, positive hepatojugular reflux, no carotid bruit, no masses Cardiac: RRR; 2/6 murmur, no rubs, or gallops Respiratory:  Scattered rales bilaterally, normal work of breathing GI: soft, nontender, nondistended, + BS MS: no deformity or atrophy; one-2+ edema; distal pulses are 2+ in all 4 extremities   Skin: warm and dry, no rash Neuro:  Strength and  sensation are intact Psych: euthymic mood, full affect   EKG:  EKG is ordered today. The ekg ordered today demonstrates sinus bradycardia, heart rate 54   Recent Labs: 05/25/2016: ALT 11; BUN 28; Creat 1.14; Hemoglobin 11.3; Platelets 159; Potassium 3.9; Sodium 142; TSH 1.86    Lipid Panel No results found for: CHOL, TRIG, HDL, CHOLHDL, VLDL, LDLCALC, LDLDIRECT   Wt Readings from Last 3 Encounters:  07/16/16 146 lb (66.2 kg)  05/25/16 140 lb (63.5 kg)  08/31/15 135 lb (61.2 kg)     Other studies Reviewed: Additional studies/ records that were reviewed today include: Office notes, hospital records and testing.  ASSESSMENT AND PLAN:  1.  Chronic diastolic CHF: She is mildly volume overloaded today. It is difficult to tell if this is acute or chronic. Her daughter is encouraged to continue daily weights. I will not increase her Lasix at this time, but the daughter is encouraged to use when necessary Lasix for edema or other symptoms of extra volume.  I will put a note in to remind myself to call her in a week to see how she is breathing and if her weight has decreased at all. If not, she may benefit from increased Lasix for a few days and then some lab work. It appears to be harder on her to leave the house. If additional management seems necessary then perhaps she would benefit from a home health nurse.  2. Bradycardia: She is only taking metoprolol 12.5 mg once a day. Which can effect her heart rate. She is also taking clonidine 0.1 mg daily. This can also have an effect on heart rate. My preference would be to discontinue the clonidine, but she needs better blood pressure control. It is not clear that she is having symptoms from the bradycardia. I will hold off on medication changes for this at this time and try to improve blood pressure control.  3. Hypertension: According to her daughter, she was on amlodipine at one time but it was stopped, presumably because her blood pressure got  too low during an acute illness. I will try restarting it at the lowest possible dose, 2.5 mg daily. If she tolerates this dose, it can be increased for better blood pressure control. It should not affect her heart rate   Current medicines are reviewed at length with the patient today.  The patient does not have concerns regarding medicines.  The following changes have been made:  Add amlodipine  Labs/ tests ordered today include: 9  No orders of the defined types were placed in this encounter.    Disposition:   FU with Dr. Duke Salvia  Signed, Duong Haydel, Bjorn Loser, PA-C  07/16/2016 4:55 PM  [place] Phone: [phone]; Fax: [phone]  This note was written with the assistance of speech recognition software. Please excuse any transcriptional errors.

## 2016-07-18 NOTE — Addendum Note (Signed)
Addended by: Barrie DunkerHOMAS, Anitta Tenny N on: 07/18/2016 03:10 PM   Modules accepted: Orders

## 2016-08-09 ENCOUNTER — Other Ambulatory Visit: Payer: Self-pay | Admitting: Internal Medicine

## 2016-08-18 ENCOUNTER — Other Ambulatory Visit: Payer: Self-pay | Admitting: Internal Medicine

## 2016-08-29 ENCOUNTER — Ambulatory Visit: Payer: Medicare Other | Admitting: Internal Medicine

## 2016-09-03 ENCOUNTER — Encounter: Payer: Self-pay | Admitting: Nurse Practitioner

## 2016-09-03 ENCOUNTER — Ambulatory Visit (INDEPENDENT_AMBULATORY_CARE_PROVIDER_SITE_OTHER): Payer: Medicare Other | Admitting: Nurse Practitioner

## 2016-09-03 VITALS — BP 120/64 | HR 59 | Temp 98.5°F | Resp 17 | Ht 62.0 in | Wt 139.6 lb

## 2016-09-03 DIAGNOSIS — I509 Heart failure, unspecified: Secondary | ICD-10-CM

## 2016-09-03 DIAGNOSIS — I10 Essential (primary) hypertension: Secondary | ICD-10-CM | POA: Diagnosis not present

## 2016-09-03 DIAGNOSIS — R609 Edema, unspecified: Secondary | ICD-10-CM

## 2016-09-03 DIAGNOSIS — N2889 Other specified disorders of kidney and ureter: Secondary | ICD-10-CM

## 2016-09-03 DIAGNOSIS — N182 Chronic kidney disease, stage 2 (mild): Secondary | ICD-10-CM | POA: Diagnosis not present

## 2016-09-03 DIAGNOSIS — Z23 Encounter for immunization: Secondary | ICD-10-CM | POA: Diagnosis not present

## 2016-09-03 LAB — BASIC METABOLIC PANEL WITH GFR
BUN: 40 mg/dL — AB (ref 7–25)
CALCIUM: 9.1 mg/dL (ref 8.6–10.4)
CHLORIDE: 103 mmol/L (ref 98–110)
CO2: 30 mmol/L (ref 20–31)
CREATININE: 1.3 mg/dL — AB (ref 0.60–0.88)
GFR, EST AFRICAN AMERICAN: 40 mL/min — AB (ref >=60)
GFR, Est Non African American: 34 mL/min — ABNORMAL LOW (ref >=60)
Glucose, Bld: 91 mg/dL (ref 65–99)
Potassium: 4 mmol/L (ref 3.5–5.3)
SODIUM: 143 mmol/L (ref 135–146)

## 2016-09-03 MED ORDER — METOPROLOL TARTRATE 25 MG PO TABS: 12.5000 mg | ORAL_TABLET | Freq: Every day | ORAL | 1 refills | Status: DC

## 2016-09-03 NOTE — Progress Notes (Signed)
Careteam: Patient Care Team: Gildardo Cranker, DO as PCP - General (Internal Medicine)  Advanced Directive information Does patient have an advance directive?: Yes, Type of Advance Directive: [place];Living will  No Known Allergies  Chief Complaint  Patient presents with  . Medical Management of Chronic Issues    3 month follow up; wants flu vaccine  . Other    Questions about lopressor dosage.     HPI: Patient is a 82 y.o. female seen in the office today for routine follow up. Pt with hx of CHF, dementia, edema, CKD and anemia.   At last visit pt was bradycardic and metoprolol was reduced to 1 tablet in the am and  1/2 tablet in the evening but she is taking 1/2 tablet in morning.    Joint pain/arthritis - she has joint stiffness. She walks using walker at home.  No recent falls. Last fall in June 2017, no injury.    Dementia - about the same, some days are better than others per daughter. Eating habits fluates.    LE edema - ongoing, Uses TED stockings daily and lasix, swelling in ankles. Avoiding sodium.    CHF hx - stable on metoprolol and lasix. Followed by cardiology   Review of Systems: by daughter and pt  Review of Systems  Constitutional: Negative for activity change, appetite change, fatigue and unexpected weight change.  HENT: Negative for congestion and hearing loss.   Eyes: Negative.   Respiratory: Negative for cough and shortness of breath.   Cardiovascular: Positive for leg swelling. Negative for chest pain and palpitations.  Gastrointestinal: Negative for abdominal pain, constipation and diarrhea.  Genitourinary: Negative for difficulty urinating and dysuria.  Musculoskeletal: Negative for arthralgias and myalgias.  Skin: Negative for color change and wound.  Neurological: Negative for dizziness and weakness.  Psychiatric/Behavioral: Negative for agitation, behavioral problems and confusion.    Past Medical History:  Diagnosis  Date  . Acute kidney injury ([place])   . Arthritis   . Bradycardia   . Confusion   . Disorder of pineal gland   . Hypertension    Past Surgical History:  Procedure Laterality Date  . CESAREAN SECTION     Social History:   reports that she has never smoked. She has never used smokeless tobacco. She reports that she does not drink alcohol or use drugs.  Family History  Problem Relation Age of Onset  . Cancer Brother   . Cancer Brother   . Cancer Mother   . Diabetes Brother   . Hypertension      Medications: Patient's Medications  New Prescriptions   No medications on file  Previous Medications   AMBULATORY NON FORMULARY MEDICATION    Transport wheelchair with leg rests   AMLODIPINE (NORVASC) 5 MG TABLET    Take 1 tablet (5 mg total) by mouth daily.   ASPIRIN 81 MG TABLET    Take 81 mg by mouth daily.   CLONIDINE (CATAPRES) 0.1 MG TABLET    TAKE 1 TABLET (0.1 MG TOTAL) BY MOUTH DAILY. PATIENTS NEEDS TO MAKE APPOINTMENT   FEEDING SUPPLEMENT, ENSURE COMPLETE, (ENSURE COMPLETE) LIQD    Take 237 mLs by mouth 2 (two) times daily between meals.   FUROSEMIDE (LASIX) 40 MG TABLET    Take 1 tablet (40 mg total) by mouth daily. Take an extra tablet as needed for swelling.   KLOR-CON 10 10 MEQ TABLET    TAKE 1 TABLET BY MOUTH EVERY DAY  METOPROLOL TARTRATE (LOPRESSOR) 25 MG TABLET    TAKE ONE TABLET BY MOUTH ONCE DAILY FOR BLOOD PRESSURE  Modified Medications   No medications on file  Discontinued Medications   No medications on file     Physical Exam:  Vitals:   09/03/16 1500  BP: 120/64  Pulse: (!) 59  Resp: 17  Temp: 98.5 F (36.9 C)  TempSrc: Oral  SpO2: 98%  Weight: 139 lb 9.6 oz (63.3 kg)  Height: '5\' 2"'$  (1.575 m)   Body mass index is 25.53 kg/m.  Physical Exam  Constitutional: She appears well-developed. No distress.  Frail appearing in NAD, sitting in w/c  Cardiovascular: Normal rate and regular rhythm.  Exam reveals no gallop and no friction rub.   Murmur  heard.  Systolic murmur is present with a grade of 1/6     Pulmonary/Chest: Effort normal and breath sounds normal. No respiratory distress.  Musculoskeletal: She exhibits edema.  [place] and large joint swelling and deformities  Neurological: She is alert.  Skin: Skin is warm and dry. No rash noted.  Psychiatric: She has a normal mood and affect. Her behavior is normal.    Labs reviewed: Basic Metabolic Panel:  Recent Labs  05/25/16 1513  NA 142  K 3.9  CL 99  CO2 32*  GLUCOSE 78  BUN 28*  CREATININE 1.14*  CALCIUM 8.9  TSH 1.86   Liver Function Tests:  Recent Labs  05/25/16 1513  AST 28  ALT 11  ALKPHOS 61  BILITOT 0.4  PROT 7.0  ALBUMIN 4.0   No results for input(s): LIPASE, AMYLASE in the last 8760 hours. No results for input(s): AMMONIA in the last 8760 hours. CBC:  Recent Labs  05/25/16 1513  WBC 3.8  NEUTROABS 1,862  HGB 11.3*  HCT 35.8  MCV 93.2  PLT 159   Lipid Panel: No results for input(s): CHOL, HDL, LDLCALC, TRIG, CHOLHDL, LDLDIRECT in the last 8760 hours. TSH:  Recent Labs  05/25/16 1513  TSH 1.86   A1C: No results found for: HGBA1C   Assessment/Plan 1. Need for immunization against influenza - Flu Vaccine QUAD 36+ mos PF IM (Fluarix & Fluzone Quad PF)  2. Essential hypertension Blood pressure is stable, heart rate stable on lopressor 12.5 mg daily - metoprolol tartrate (LOPRESSOR) 25 MG tablet; Take 0.5 tablets (12.5 mg total) by mouth daily.  Dispense: 45 tablet; Refill: 1 - BMP with eGFR  3. Chronic congestive heart failure, unspecified congestive heart failure type (HCC) Stable, conts on metoprolol 12.5 mg daily and lasix with potassium supplement - BMP with eGFR  4. Chronic renal insufficiency, stage II (mild) -encouraged hydration, to avoid NSAIDS - BMP with eGFR  5. Edema, unspecified type Stable on lasix.   6. Need for pneumococcal vaccination - Pneumococcal conjugate vaccine 13-valent    Ameli Sangiovanni K.  Harle Battiest  [facility] [phone] 8 am - 5 pm) [phone] (after hours)

## 2016-09-04 NOTE — Addendum Note (Signed)
Addended by: Chriss DriverLANE, TERRY L on: 09/04/2016 09:09 AM   Modules accepted: Orders

## 2016-09-10 ENCOUNTER — Ambulatory Visit: Payer: Medicare Other | Admitting: Cardiovascular Disease

## 2016-10-04 ENCOUNTER — Ambulatory Visit (INDEPENDENT_AMBULATORY_CARE_PROVIDER_SITE_OTHER): Payer: Medicare Other | Admitting: Cardiology

## 2016-10-04 ENCOUNTER — Encounter: Payer: Self-pay | Admitting: Cardiology

## 2016-10-04 VITALS — BP 110/90 | HR 136 | Ht 64.0 in | Wt 150.0 lb

## 2016-10-04 DIAGNOSIS — I509 Heart failure, unspecified: Secondary | ICD-10-CM | POA: Diagnosis not present

## 2016-10-04 DIAGNOSIS — I110 Hypertensive heart disease with heart failure: Secondary | ICD-10-CM

## 2016-10-04 DIAGNOSIS — N182 Chronic kidney disease, stage 2 (mild): Secondary | ICD-10-CM

## 2016-10-04 DIAGNOSIS — I43 Cardiomyopathy in diseases classified elsewhere: Secondary | ICD-10-CM

## 2016-10-04 DIAGNOSIS — I4891 Unspecified atrial fibrillation: Secondary | ICD-10-CM

## 2016-10-04 DIAGNOSIS — R001 Bradycardia, unspecified: Secondary | ICD-10-CM | POA: Diagnosis not present

## 2016-10-04 LAB — CBC WITH DIFFERENTIAL/PLATELET
Basophils Absolute: 0 cells/uL (ref 0–200)
Basophils Relative: 0 %
Eosinophils Absolute: 45 cells/uL (ref 15–500)
Eosinophils Relative: 1 %
HCT: 35.5 % (ref 35.0–45.0)
Hemoglobin: 11.3 g/dL — ABNORMAL LOW (ref 11.7–15.5)
Lymphocytes Relative: 19 %
Lymphs Abs: 855 cells/uL (ref 850–3900)
MCH: 29.8 pg (ref 27.0–33.0)
MCHC: 31.8 g/dL — ABNORMAL LOW (ref 32.0–36.0)
MCV: 93.7 fL (ref 80.0–100.0)
MPV: 11.9 fL (ref 7.5–12.5)
Monocytes Absolute: 315 cells/uL (ref 200–950)
Monocytes Relative: 7 %
Neutro Abs: 3285 cells/uL (ref 1500–7800)
Neutrophils Relative %: 73 %
Platelets: 154 10*3/uL (ref 140–400)
RBC: 3.79 MIL/uL — ABNORMAL LOW (ref 3.80–5.10)
RDW: 16.3 % — ABNORMAL HIGH (ref 11.0–15.0)
WBC: 4.5 10*3/uL (ref 3.8–10.8)

## 2016-10-04 MED ORDER — APIXABAN 2.5 MG PO TABS: 2.5000 mg | ORAL_TABLET | Freq: Two times a day (BID) | ORAL | 6 refills | Status: DC

## 2016-10-04 MED ORDER — FUROSEMIDE 40 MG PO TABS: 40.0000 mg | ORAL_TABLET | Freq: Every day | ORAL | 3 refills | Status: DC

## 2016-10-04 MED ORDER — METOPROLOL TARTRATE 25 MG PO TABS: ORAL_TABLET | ORAL | 3 refills | Status: DC

## 2016-10-04 NOTE — Assessment & Plan Note (Signed)
When in NSR

## 2016-10-04 NOTE — Patient Instructions (Signed)
Take Lasix 40 mg when you get home then take Lasix 40 mg daily   Take Lopressor 25 mg when you get home then take Lopressor 25 mg 1/2 tablet tonight then Take Lopressor 25 mg 1/2 tablet twice a day.    Start Eliquis 2.5 mg twice a day    Lab work today bmet,cbc,magnesium    Appointment scheduled with Dr. Wednesday 10/10/16 at 2:30 pm.

## 2016-10-04 NOTE — Assessment & Plan Note (Signed)
Seen in the office today with new AF with VR 136 and some CHF on exam

## 2016-10-04 NOTE — Assessment & Plan Note (Signed)
CHF secondary to AF with RVR and MR

## 2016-10-04 NOTE — Assessment & Plan Note (Signed)
Cr clearance 26

## 2016-10-04 NOTE — Progress Notes (Signed)
10/04/2016 Rachel BurrowLois D Hickman   1917-09-18  [id number]  Primary Physician Kirt Boysarter, Monica, DO Primary Cardiologist: Dr Duke Salviaandolph  HPI:  Pleasant 82 y/o female seen in the office today with dyspnea. The pt has a history of HTN and bradycardia. Her echo May of 2016 showed an EF of 50-55% with MR, TR, and severe LAE. Atrial fibrillation has not been an issue till now. The pt is in AF with VR 136. She has CHF on exam. Her diuretics had been cut back in Nov secondary to a BUN of 40 and her Lopressor had previosly been cut back to 12.5 mg daily. She is unaware of tachycardia but the pt's daughter notes her HR is at times fast, then at times slower. The pt uses a walker at home.    Current Outpatient Prescriptions  Medication Sig Dispense Refill  . AMBULATORY NON FORMULARY MEDICATION Transport wheelchair with leg rests 1 each 0  . amLODipine (NORVASC) 5 MG tablet Take 1 tablet (5 mg total) by mouth daily. 180 tablet 3  . aspirin 81 MG tablet Take 81 mg by mouth daily.    . cloNIDine (CATAPRES) 0.1 MG tablet TAKE 1 TABLET (0.1 MG TOTAL) BY MOUTH DAILY. PATIENTS NEEDS TO MAKE APPOINTMENT 30 tablet 3  . feeding supplement, ENSURE COMPLETE, (ENSURE COMPLETE) LIQD Take 237 mLs by mouth 2 (two) times daily between meals.    . furosemide (LASIX) 20 MG tablet Take 20 mg by mouth daily.    Marland Kitchen. KLOR-CON 10 10 MEQ tablet TAKE 1 TABLET BY MOUTH EVERY DAY 30 tablet 2  . metoprolol tartrate (LOPRESSOR) 25 MG tablet Take 0.5 tablets (12.5 mg total) by mouth daily. 45 tablet 1   No current facility-administered medications for this visit.     No Known Allergies  Social History   Social History  . Marital status: Widowed    Spouse name: N/A  . Number of children: N/A  . Years of education: N/A   Occupational History  . Not on file.   Social History Main Topics  . Smoking status: Never Smoker  . Smokeless tobacco: Never Used  . Alcohol use No  . Drug use: No  . Sexual activity: No   Other Topics  Concern  . Not on file   Social History Narrative   Diet- Yes, Healthy heart   Caffeine- Yes   Married- 1946, Widowed   House- Yes, moved in with daughter (11/30/14)   Pets- No   Current/past profession-   Exercise- Infrequently   Living will- No   DNR-No   POA/HPOA-No           Review of Systems: General: negative for chills, fever, night sweats or weight changes.  Cardiovascular: negative for chest pain, dyspnea on exertion, edema, orthopnea, palpitations, paroxysmal nocturnal dyspnea or shortness of breath Dermatological: negative for rash Respiratory: negative for cough or wheezing Urologic: negative for hematuria Abdominal: negative for nausea, vomiting, diarrhea, bright red blood per rectum, melena, or hematemesis Neurologic: negative for visual changes, syncope, or dizziness All other systems reviewed and are otherwise negative except as noted above.    Blood pressure 110/90, pulse (!) 136, height 5\' 4"  (1.626 m), weight 150 lb (68 kg).  General appearance: alert, cooperative, appears stated age, no distress and in wheel chair Neck: JVD to angle of jaw Lungs: rales Lt base Heart: irregularly irregular rhythm Extremities: chronic LE edema, compression stockings in place Skin: cool, dry Neurologic: Grossly normal  EKG AF with RVR  ASSESSMENT  AND PLAN:   Atrial fibrillation, new onset (HCC) Seen in the office today with new AF with VR 136 and some CHF on exam  Acute CHF (HCC) CHF secondary to AF with RVR and MR  Chronic renal insufficiency, stage II (mild) Cr clearance 26  Hypertensive cardiomyopathy, with heart failure (HCC) Controlled, echo 2016- EF 50-55% with severe LAE, MR, TR  Bradycardia When in NSR   PLAN  Lopressor 25 mg now x 1, Lasix 40 mg now x 1, start Eliquis 2.5 mg BID (I discussed the risk and benefits with the pt and her daughter) increase daily Lopressor to 12.5 mg BID starting this pm, increase daily Lasix to 40 mg starting tomorrow. F/U  with Dr Duke Salviaandolph next week. Labs today- CBC, CMP, Mg++. Hopefully with treating her CHF and slowing her rate she'll convert back to NSR.  Corine ShelterLuke Jaice Digioia PA-C 10/04/2016 11:45 AM

## 2016-10-04 NOTE — Assessment & Plan Note (Signed)
Controlled, echo 2016- EF 50-55% with severe LAE, MR, TR

## 2016-10-05 ENCOUNTER — Telehealth: Payer: Self-pay | Admitting: Cardiovascular Disease

## 2016-10-05 LAB — BASIC METABOLIC PANEL WITH GFR
BUN: 45 mg/dL — ABNORMAL HIGH (ref 7–25)
CO2: 20 mmol/L (ref 20–31)
Calcium: 8.9 mg/dL (ref 8.6–10.4)
Chloride: 107 mmol/L (ref 98–110)
Creat: 1.62 mg/dL — ABNORMAL HIGH (ref 0.60–0.88)
Glucose, Bld: 90 mg/dL (ref 65–99)
Potassium: 4.6 mmol/L (ref 3.5–5.3)
Sodium: 143 mmol/L (ref 135–146)

## 2016-10-05 LAB — MAGNESIUM: Magnesium: 2.2 mg/dL (ref 1.5–2.5)

## 2016-10-05 MED ORDER — FUROSEMIDE 40 MG PO TABS: ORAL_TABLET | ORAL | 3 refills | Status: DC

## 2016-10-05 NOTE — Telephone Encounter (Signed)
F/u Message ° °Pt daughter returning RN call. Please call back to discuss  °

## 2016-10-05 NOTE — Telephone Encounter (Signed)
Communicated on results, updated.

## 2016-10-10 ENCOUNTER — Encounter: Payer: Self-pay | Admitting: Cardiovascular Disease

## 2016-10-10 ENCOUNTER — Ambulatory Visit: Payer: Medicare Other | Admitting: Cardiovascular Disease

## 2016-10-10 ENCOUNTER — Ambulatory Visit (INDEPENDENT_AMBULATORY_CARE_PROVIDER_SITE_OTHER): Payer: Medicare Other | Admitting: Cardiovascular Disease

## 2016-10-10 VITALS — BP 124/94 | HR 118 | Ht 64.0 in | Wt 148.8 lb

## 2016-10-10 DIAGNOSIS — I5033 Acute on chronic diastolic (congestive) heart failure: Secondary | ICD-10-CM | POA: Diagnosis not present

## 2016-10-10 DIAGNOSIS — I4819 Other persistent atrial fibrillation: Secondary | ICD-10-CM

## 2016-10-10 DIAGNOSIS — I110 Hypertensive heart disease with heart failure: Secondary | ICD-10-CM

## 2016-10-10 DIAGNOSIS — I340 Nonrheumatic mitral (valve) insufficiency: Secondary | ICD-10-CM

## 2016-10-10 DIAGNOSIS — I481 Persistent atrial fibrillation: Secondary | ICD-10-CM

## 2016-10-10 MED ORDER — METOPROLOL TARTRATE 25 MG PO TABS: 25.0000 mg | ORAL_TABLET | Freq: Two times a day (BID) | ORAL | 5 refills | Status: DC

## 2016-10-10 NOTE — Progress Notes (Signed)
Cardiology Office Note   Date:  10/17/2016   ID:  Rachel Hickman, DOB 1916/11/21, MRN [id number]  PCP:  Kirt Boysarter, Monica, DO  Cardiologist:   Chilton Siiffany Johnson, MD   Chief Complaint  Patient presents with  . Follow-up    1 week;  . Shortness of Breath    randomly.  . Edema    feet, ankles.  . Leg Pain    cramping in legs.      History of Present Illness: Rachel Hickman is a 82 y.o. female with hypertension, bradycardia and moderate TR/MR who presents for follow up.  She is accompanied by her daughter who provides much of the history.  Rachel Hickman has been taking lasix for lower extremity edema.  However she continues to have swelling intermittently.  For the last week she notes increased shortness of breath.  She also occasionally gets chest pain. She is not very physically active, so it typically occurs at rest.  She reports that her heart is sometimes "out of rhythm." She gets more short of breath when this occurs.    Past Medical History:  Diagnosis Date  . Acute kidney injury (HCC)   . Arthritis   . Bradycardia   . Confusion   . Disorder of pineal gland   . Hypertension     Past Surgical History:  Procedure Laterality Date  . CESAREAN SECTION       Current Outpatient Prescriptions  Medication Sig Dispense Refill  . AMBULATORY NON FORMULARY MEDICATION Transport wheelchair with leg rests 1 each 0  . amLODipine (NORVASC) 5 MG tablet Take 1 tablet (5 mg total) by mouth daily. 180 tablet 3  . apixaban (ELIQUIS) 2.5 MG TABS tablet Take 1 tablet (2.5 mg total) by mouth 2 (two) times daily. 60 tablet 6  . feeding supplement, ENSURE COMPLETE, (ENSURE COMPLETE) LIQD Take 237 mLs by mouth 2 (two) times daily between meals.    . furosemide (LASIX) 40 MG tablet Take 1 tablet (40mg ) by mouth MWF, take 1/2 tablet (20mg ) all other days. 90 tablet 3  . KLOR-CON 10 10 MEQ tablet TAKE 1 TABLET BY MOUTH EVERY DAY 30 tablet 2  . metoprolol (LOPRESSOR) 50 MG tablet Take 1 tablet (50 mg  total) by mouth 2 (two) times daily. 180 tablet 3   No current facility-administered medications for this visit.     Allergies:   Patient has no known allergies.    Social History:  The patient  reports that she has never smoked. She has never used smokeless tobacco. She reports that she does not drink alcohol or use drugs.   Family History:  The patient's family history includes Cancer in her brother, brother, and mother; Diabetes in her brother.    ROS:  Please see the history of present illness.   Otherwise, review of systems are positive for none.   All other systems are reviewed and negative.    PHYSICAL EXAM: VS:  BP (!) 124/94   Pulse (!) 118   Ht 5\' 4"  (1.626 m)   Wt 67.5 kg (148 lb 12.8 oz)   BMI 25.54 kg/m  , BMI Body mass index is 25.54 kg/m. GENERAL:  Well appearing HEENT:  Pupils equal round and reactive, fundi not visualized, oral mucosa unremarkable NECK:  JVP 3cm above clavicle sitting upright.  Waveform with prominent CV waves, carotid upstroke brisk and symmetric, no bruits LYMPHATICS:  No cervical adenopathy LUNGS:  Clear to auscultation bilaterally HEART:  Irregularly irregular.  PMI not displaced or sustained,S1 and S2 within normal limits, no S3, no S4, no clicks, no rubs,III/VI holosytolic murmur at the apex ABD:  Flat, positive bowel sounds normal in frequency in pitch, no bruits, no rebound, no guarding, no midline pulsatile mass, no hepatomegaly, no splenomegaly EXT:  2 plus pulses throughout, no edema, no cyanosis no clubbing SKIN:  No rashes no nodules NEURO:  Cranial nerves II through XII grossly intact, motor grossly intact throughout PSYCH:  Cognitively intact, oriented to person place and time   EKG:  EKG is not ordered today. The ekg ordered 07/27/15 demonstrates sinus rhythm at 71 bpm.  LVH with secondary repolarization abnormalities.  One PVC.  Echo 03/01/15: Study Conclusions  - Left ventricle: Severe basal septal hypertrophy. The cavity  size was moderately dilated. Systolic function was normal. The estimated ejection fraction was in the range of 50% to 55%. - Aortic valve: There was mild regurgitation. - Mitral valve: Moderate eccentric MR directed posteriorly mechanism not clear ? restricted posterior leaflet There was moderate regurgitation. - Left atrium: The atrium was severely dilated. - Right atrium: The atrium was mildly dilated. - Atrial septum: No defect or patent foramen ovale was identified. - Tricuspid valve: There was moderate regurgitation.  Recent Labs: 05/25/2016: ALT 11; TSH 1.86 10/04/2016: BUN 45; Creat 1.62; Hemoglobin 11.3; Magnesium 2.2; Platelets 154; Potassium 4.6; Sodium 143    Lipid Panel No results found for: CHOL, TRIG, HDL, CHOLHDL, VLDL, LDLCALC, LDLDIRECT    Wt Readings from Last 3 Encounters:  10/17/16 67 kg (147 lb 12.8 oz)  10/10/16 67.5 kg (148 lb 12.8 oz)  10/04/16 68 kg (150 lb)      Other studies Reviewed: Additional studies/ records that were reviewed today include: \Review of the above records demonstrates:  Please see elsewhere in the note.     ASSESSMENT AND PLAN:  # Atrial fibrillation: Rachel Hickman remains in atrial fibrillation and her heart rate is poorly-controlled.  We will increase her metoprolol to 25mg  bid.  She is volume overloaded due to atrial fibrillation with RVR.  Continue Eliquis.  We will stop aspirin given that she is on Eliquis.   # Moderate MR/TR, LE edema: Rachel Hickman appears volume overloaded today.  We will increase lasix to 40mg  bid for 3 days and then 40mg  daily. Rate control as above.  # Hypertension: BP is controlled.  Increase metoprolol as above.  Continue carvedilol and amlodipine.  If she needs BP room, these can be stopped so that her beta blocker can be increased further.   Current medicines are reviewed at length with the patient today.  The patient does not have concerns regarding medicines.  The following changes have been made:   Increase lasix to bid.  Increase metoprolol to 25mg  bid.   Labs/ tests ordered today include:   No orders of the defined types were placed in this encounter.    Disposition:   FU with Kiasia Chou C. Duke Salviaandolph, MD in 4-6 weeks.  APP in 1 week.   Signed, Chilton Siiffany Climax, MD  10/17/2016 6:27 PM    [facility]

## 2016-10-10 NOTE — Patient Instructions (Addendum)
Medication Instructions:  INCREASE YOUR METOPROLOL TO 25 MG TWICE A DAY   STOP ASPIRIN   Labwork: NONE  Testing/Procedures: NONE  Follow-Up: Your physician recommends that you schedule a follow-up appointment in: 1 WEEK WITH NP/PA  Your physician recommends that you schedule a follow-up appointment [facility]: [facility]   If you need a refill on your cardiac medications before your next appointment, please call your pharmacy.

## 2016-10-17 ENCOUNTER — Encounter: Payer: Self-pay | Admitting: Physician Assistant

## 2016-10-17 ENCOUNTER — Ambulatory Visit (INDEPENDENT_AMBULATORY_CARE_PROVIDER_SITE_OTHER): Payer: Medicare Other | Admitting: Physician Assistant

## 2016-10-17 VITALS — BP 104/68 | HR 124 | Ht 64.0 in | Wt 147.8 lb

## 2016-10-17 DIAGNOSIS — Z7901 Long term (current) use of anticoagulants: Secondary | ICD-10-CM

## 2016-10-17 DIAGNOSIS — I481 Persistent atrial fibrillation: Secondary | ICD-10-CM

## 2016-10-17 DIAGNOSIS — N189 Chronic kidney disease, unspecified: Secondary | ICD-10-CM

## 2016-10-17 DIAGNOSIS — I10 Essential (primary) hypertension: Secondary | ICD-10-CM

## 2016-10-17 DIAGNOSIS — N289 Disorder of kidney and ureter, unspecified: Secondary | ICD-10-CM

## 2016-10-17 DIAGNOSIS — R600 Localized edema: Secondary | ICD-10-CM | POA: Diagnosis not present

## 2016-10-17 DIAGNOSIS — I4819 Other persistent atrial fibrillation: Secondary | ICD-10-CM

## 2016-10-17 LAB — CBC
HCT: 40.6 % (ref 35.0–45.0)
Hemoglobin: 12.6 g/dL (ref 11.7–15.5)
MCH: 29.5 pg (ref 27.0–33.0)
MCHC: 31 g/dL — AB (ref 32.0–36.0)
MCV: 95.1 fL (ref 80.0–100.0)
MPV: 11.8 fL (ref 7.5–12.5)
PLATELETS: 189 10*3/uL (ref 140–400)
RBC: 4.27 MIL/uL (ref 3.80–5.10)
RDW: 16.4 % — ABNORMAL HIGH (ref 11.0–15.0)
WBC: 4.4 10*3/uL (ref 3.8–10.8)

## 2016-10-17 MED ORDER — METOPROLOL TARTRATE 50 MG PO TABS: 50.0000 mg | ORAL_TABLET | Freq: Two times a day (BID) | ORAL | 3 refills | Status: AC

## 2016-10-17 NOTE — Progress Notes (Signed)
Cardiology Office Note    Date:  10/17/2016   ID:  Rachel Hickman, DOB October 26, 1916, MRN [id number]  PCP:  Kirt Boysarter, Monica, DO  Cardiologist:  Dr. Duke Salviaandolph  Chief Complaint  Patient presents with  . Follow-up    seen for Dr. Duke Salviaandolph, atrial fibrillation and LE edema    History of Present Illness:  Rachel RoesLois D Petitjean is a 82 y.o. female with PMH of HTN, bradycardia, moderate TR/MR. She has been taking Lasix for lower extremity edema. She continued to have intermittent shortness of breath as well. Her Lasix was increased to 40 mg daily from 20 mg daily. Due to persistent lower extremity edema, she was transitioned to Bumex but had intravascular volume depletion requiring hospitalization. On her last office visit on 10/04/2016, she was noted to be in atrial fibrillation with heart rate of 136. She was also noted to be fluid overloaded on physical exam. Her last office visit was on 10/10/2016, her Lasix was increased to 40 mg twice a day for 3 days before going back to 40 mg daily thereafter. He has been instructed to take an additional dose of Lasix if her weight increased by more than 2 pounds overnight or 5 pounds in a single week.  She presents today for cardiology office visit. She is wearing compression stocking, however she continued to have 1-2+ pitting edema in bilateral lower extremity. According to the daughter who manages her diuretic, her lasix was actually increased to 40 mg daily before dropping back down to 40 mg Monday Wednesday Friday and 20 mg all the other days. She is an not aware of any edema secondary to her dementia. She also denies any shortness of breath although according to the daughter who was accompanying her, she does seems to have shortness of breath especially when she walks around with her walker. She has no cardiac awareness of atrial fibrillation and denies any chest discomfort or palpitation. On the EKG today, she is still in atrial fibrillation with a heart rate of 120s.  Her blood pressure is borderline however she is on multiple blood pressure medications. I have discussed with Dr. Duke Salviaandolph, we eventually decided to stop her clonidine and start her on a high dose of metoprolol 50 mg twice a day. Given her advanced age, I am hesitant to over diurese her. She did have some worsening renal function on some the previous labs. I will obtain a basic metabolic panel and CBC before moving forward.   Past Medical History:  Diagnosis Date  . Acute kidney injury (HCC)   . Arthritis   . Bradycardia   . Confusion   . Disorder of pineal gland   . Hypertension     Past Surgical History:  Procedure Laterality Date  . CESAREAN SECTION      Current Medications: Outpatient Medications Prior to Visit  Medication Sig Dispense Refill  . AMBULATORY NON FORMULARY MEDICATION Transport wheelchair with leg rests 1 each 0  . apixaban (ELIQUIS) 2.5 MG TABS tablet Take 1 tablet (2.5 mg total) by mouth 2 (two) times daily. 60 tablet 6  . feeding supplement, ENSURE COMPLETE, (ENSURE COMPLETE) LIQD Take 237 mLs by mouth 2 (two) times daily between meals.    . furosemide (LASIX) 40 MG tablet Take 1 tablet (40mg ) by mouth MWF, take 1/2 tablet (20mg ) all other days. 90 tablet 3  . KLOR-CON 10 10 MEQ tablet TAKE 1 TABLET BY MOUTH EVERY DAY 30 tablet 2  . cloNIDine (CATAPRES) 0.1 MG tablet TAKE  1 TABLET (0.1 MG TOTAL) BY MOUTH DAILY. PATIENTS NEEDS TO MAKE APPOINTMENT 30 tablet 3  . metoprolol tartrate (LOPRESSOR) 25 MG tablet Take 1 tablet (25 mg total) by mouth 2 (two) times daily. 60 tablet 5  . amLODipine (NORVASC) 5 MG tablet Take 1 tablet (5 mg total) by mouth daily. 180 tablet 3   No facility-administered medications prior to visit.      Allergies:   Patient has no known allergies.   Social History   Social History  . Marital status: Widowed    Spouse name: N/A  . Number of children: N/A  . Years of education: N/A   Social History Main Topics  . Smoking status: Never  Smoker  . Smokeless tobacco: Never Used  . Alcohol use No  . Drug use: No  . Sexual activity: No   Other Topics Concern  . None   Social History Narrative   Diet- Yes, Healthy heart   Caffeine- Yes   Married- 1946, Widowed   House- Yes, moved in with daughter (11/30/14)   Pets- No   Current/past profession-   Exercise- Infrequently   Living will- No   DNR-No   POA/HPOA-No           Family History:  The patient's family history includes Cancer in her brother, brother, and mother; Diabetes in her brother.   ROS:   Please see the history of present illness.    ROS All other systems reviewed and are negative.   PHYSICAL EXAM:   VS:  BP 104/68   Pulse (!) 124   Ht 5\' 4"  (1.626 m)   Wt 147 lb 12.8 oz (67 kg)   BMI 25.37 kg/m    GEN: Well nourished, well developed, in no acute distress  HEENT: normal  Neck: no JVD, carotid bruits, or masses Cardiac: irregularly irregular; no murmurs, rubs, or gallops. 1-2+ bilateral LE edema  Respiratory:  clear to auscultation bilaterally, normal work of breathing GI: soft, nontender, nondistended, + BS MS: no deformity or atrophy  Skin: warm and dry, no rash Neuro:  Alert and Oriented x 3, Strength and sensation are intact Psych: euthymic mood, full affect  Wt Readings from Last 3 Encounters:  10/17/16 147 lb 12.8 oz (67 kg)  10/10/16 148 lb 12.8 oz (67.5 kg)  10/04/16 150 lb (68 kg)      Studies/Labs Reviewed:   EKG:  EKG is ordered today.  The ekg ordered today demonstrates afib with HR 124  Recent Labs: 05/25/2016: ALT 11; TSH 1.86 10/04/2016: BUN 45; Creat 1.62; Hemoglobin 11.3; Magnesium 2.2; Platelets 154; Potassium 4.6; Sodium 143   Lipid Panel No results found for: CHOL, TRIG, HDL, CHOLHDL, VLDL, LDLCALC, LDLDIRECT  Additional studies/ records that were reviewed today include:   Echo 03/01/2015 LV EF: 50% -   55%  - Left ventricle: Severe basal septal hypertrophy. The cavity size   was moderately dilated.  Systolic function was normal. The   estimated ejection fraction was in the range of 50% to 55%. - Aortic valve: There was mild regurgitation. - Mitral valve: Moderate eccentric MR directed posteriorly   mechanism not clear ? restricted posterior leaflet There was   moderate regurgitation. - Left atrium: The atrium was severely dilated. - Right atrium: The atrium was mildly dilated. - Atrial septum: No defect or patent foramen ovale was identified. - Tricuspid valve: There was moderate regurgitation.   ASSESSMENT:    1. Persistent atrial fibrillation (HCC)   2. Acute on  chronic renal insufficiency   3. Encounter for current long-term use of anticoagulants   4. Bilateral leg edema   5. Essential hypertension      PLAN:  In order of problems listed above:  1. Persistent atrial fibrillation with RVR: On low-dose eliquis 2.5 mg twice a day. No obvious bleeding issues. Obtain CBC. Unfortunately her heart rate is still uncontrolled after 2 weeks, her metoprolol was increased during the last visit. Her blood pressure is borderline today, we will stop her clonidine and increase her metoprolol to 50 mg twice a day. I have instructed her daughter to obtain blood pressure twice a day at home. If we need more blood pressure room, we can potentially stop amlodipine and increase metoprolol further or add digoxin.  2. Bilateral lower extremity edema: Persistent edema despite increased diuretic, likely has some relationship to persistent atrial fibrillation with RVR as loss of atrial kick can potentially cause increased fluid overload. Previous labs shows worsening renal function. We'll hold off on increased diuretic and continue at current level until we can repeat a basic metabolic panel. Surprisingly, according to the daughter she is now currently on 40 mg Lasix every Monday Wednesday Friday and 20 mg Lasix on the other days. We may have to increase her to 40 mg daily.  3. Hypertension:  Well-controlled on amlodipine, clonidine, and metoprolol. We plan to stop the clonidine in order to increase on the metoprolol dosage.    Medication Adjustments/Labs and Tests Ordered: Current medicines are reviewed at length with the patient today.  Concerns regarding medicines are outlined above.  Medication changes, Labs and Tests ordered today are listed in the Patient Instructions below. Patient Instructions  Medication Instructions:  1. INCREASE METOPROLOL TO 50 MG TWICE DAILY; NEW RX HAS BEEN SENT   2. STOP THE CLONIDINE  Labwork: TODAY BMET, CBC  Testing/Procedures: NONE  Follow-Up: 1 WEEK WITH Laterrance Nauta, PAC TO FOLLOW UP ON YOUR BLOOD PRESSURE  Any Other Special Instructions Will Be Listed Below (If Applicable).     If you need a refill on your cardiac medications before your next appointment, please call your pharmacy.      Ramond DialSigned, Jernard Reiber, [place]  10/17/2016 9:52 PM    [facility] [address], [place], [place]  [address] Phone: [phone]; Fax: [phone]) [phone]

## 2016-10-17 NOTE — Patient Instructions (Signed)
Medication Instructions:  1. INCREASE METOPROLOL TO 50 MG TWICE DAILY; NEW RX HAS BEEN SENT   2. STOP THE CLONIDINE  Labwork: TODAY BMET, CBC  Testing/Procedures: NONE  Follow-Up: 1 WEEK WITH HAO MENG, PAC TO FOLLOW UP ON YOUR BLOOD PRESSURE  Any Other Special Instructions Will Be Listed Below (If Applicable).     If you need a refill on your cardiac medications before your next appointment, please call your pharmacy.

## 2016-10-18 LAB — BASIC METABOLIC PANEL
BUN: 30 mg/dL — AB (ref 7–25)
CALCIUM: 8.7 mg/dL (ref 8.6–10.4)
CO2: 26 mmol/L (ref 20–31)
Chloride: 109 mmol/L (ref 98–110)
Creat: 1.4 mg/dL — ABNORMAL HIGH (ref 0.60–0.88)
GLUCOSE: 98 mg/dL (ref 65–99)
Potassium: 4 mmol/L (ref 3.5–5.3)
SODIUM: 147 mmol/L — AB (ref 135–146)

## 2016-10-26 ENCOUNTER — Ambulatory Visit: Payer: Medicare Other | Admitting: Physician Assistant

## 2016-10-26 NOTE — Progress Notes (Deleted)
Cardiology Office Note    Date:  10/26/2016   ID:  [name], DOB 07/17/1917, MRN [id number]  PCP:  Rachel Boys, DO  Cardiologist:  Dr. Duke Salvia   No chief complaint on file.   History of Present Illness:  Rachel Hickman is a 83 y.o. female with PMH of HTN, bradycardia, moderate TR/MR. She has been taking Lasix for lower extremity edema. She continued to have intermittent shortness of breath as well. Her Lasix was increased to 40 mg daily from 20 mg daily. Due to persistent lower extremity edema, she was transitioned to Bumex but had intravascular volume depletion requiring hospitalization. On her last office visit on 10/04/2016, she was noted to be in atrial fibrillation with heart rate of 136. She was also noted to be fluid overloaded on physical exam. Her last office visit was on 10/10/2016, her Lasix was increased to 40 mg twice a day for 3 days before going back to 40 mg daily thereafter. He has been instructed to take an additional dose of Lasix if her weight increased by more than 2 pounds overnight or 5 pounds in a single week.  I saw the patient a week ago on follow-up, she was still having 1-2+ pitting edema in bilateral lower extremity. According to the daughter, her Lasix was actually increased to 40 mg daily before traveling back down to 40 mg Monday Wednesday Friday and the 20 mg on all other days. Her heart rate was also poorly controlled as a time. After discussing with Dr. Duke Salvia, we eventually decided to stop her clonidine and start her on a higher dose of metoprolol 50 mg twice a day. I did not change her diuretic due to concern of overdiuresis, I did obtain repeat CBC and basic metabolic panel. Her anemia seems to be improving. Hemoglobin now back to normal. Renal function also appears to be relatively stable prior to the recent labs, however still mildly elevated compared to her previous creatinine of 1.14 in August 2017.  No EKG    Past Medical History:  Diagnosis  Date  . Acute kidney injury (HCC)   . Arthritis   . Bradycardia   . Confusion   . Disorder of pineal gland   . Hypertension     Past Surgical History:  Procedure Laterality Date  . CESAREAN SECTION      Current Medications: Outpatient Medications Prior to Visit  Medication Sig Dispense Refill  . AMBULATORY NON FORMULARY MEDICATION Transport wheelchair with leg rests 1 each 0  . amLODipine (NORVASC) 5 MG tablet Take 1 tablet (5 mg total) by mouth daily. 180 tablet 3  . apixaban (ELIQUIS) 2.5 MG TABS tablet Take 1 tablet (2.5 mg total) by mouth 2 (two) times daily. 60 tablet 6  . feeding supplement, ENSURE COMPLETE, (ENSURE COMPLETE) LIQD Take 237 mLs by mouth 2 (two) times daily between meals.    . furosemide (LASIX) 40 MG tablet Take 1 tablet (40mg ) by mouth MWF, take 1/2 tablet (20mg ) all other days. 90 tablet 3  . KLOR-CON 10 10 MEQ tablet TAKE 1 TABLET BY MOUTH EVERY DAY 30 tablet 2  . metoprolol (LOPRESSOR) 50 MG tablet Take 1 tablet (50 mg total) by mouth 2 (two) times daily. 180 tablet 3   No facility-administered medications prior to visit.      Allergies:   Patient has no known allergies.   Social History   Social History  . Marital status: Widowed    Spouse name: N/A  .  Number of children: N/A  . Years of education: N/A   Social History Main Topics  . Smoking status: Never Smoker  . Smokeless tobacco: Never Used  . Alcohol use No  . Drug use: No  . Sexual activity: No   Other Topics Concern  . Not on file   Social History Narrative   Diet- Yes, Healthy heart   Caffeine- Yes   Married- 1946, Widowed   House- Yes, moved in with daughter (11/30/14)   Pets- No   Current/past profession-   Exercise- Infrequently   Living will- No   DNR-No   POA/HPOA-No           Family History:  The patient's ***family history includes Cancer in her brother, brother, and mother; Diabetes in her brother.   ROS:   Please see the history of present illness.    ROS All  other systems reviewed and are negative.   PHYSICAL EXAM:   VS:  There were no vitals taken for this visit.   GEN: Well nourished, well developed, in no acute distress HEENT: normal Neck: no JVD, carotid bruits, or masses Cardiac: ***RRR; no murmurs, rubs, or gallops,no edema  Respiratory:  clear to auscultation bilaterally, normal work of breathing GI: soft, nontender, nondistended, + BS MS: no deformity or atrophy Skin: warm and dry, no rash Neuro:  Alert and Oriented x 3, Strength and sensation are intact Psych: euthymic mood, full affect  Wt Readings from Last 3 Encounters:  10/17/16 147 lb 12.8 oz (67 kg)  10/10/16 148 lb 12.8 oz (67.5 kg)  10/04/16 150 lb (68 kg)      Studies/Labs Reviewed:   EKG:  EKG is*** ordered today.  The ekg ordered today demonstrates ***  Recent Labs: 05/25/2016: ALT 11; TSH 1.86 10/04/2016: Magnesium 2.2 10/17/2016: BUN 30; Creat 1.40; Hemoglobin 12.6; Platelets 189; Potassium 4.0; Sodium 147   Lipid Panel No results found for: CHOL, TRIG, HDL, CHOLHDL, VLDL, LDLCALC, LDLDIRECT  Additional studies/ records that were reviewed today include:  ***    ASSESSMENT:    No diagnosis found.   PLAN:  In order of problems listed above:  1. ***    Medication Adjustments/Labs and Tests Ordered: Current medicines are reviewed at length with the patient today.  Concerns regarding medicines are outlined above.  Medication changes, Labs and Tests ordered today are listed in the Patient Instructions below. There are no Patient Instructions on file for this visit.   Ramond DialSigned, Binnie Vonderhaar, [place]  10/26/2016 12:42 PM    [facility] [address], [place], [place]  [address] Phone: [phone]; Fax: [phone]) [phone]

## 2016-10-31 ENCOUNTER — Encounter: Payer: Self-pay | Admitting: Physician Assistant

## 2016-10-31 ENCOUNTER — Ambulatory Visit (INDEPENDENT_AMBULATORY_CARE_PROVIDER_SITE_OTHER): Payer: Medicare Other | Admitting: Physician Assistant

## 2016-10-31 VITALS — BP 127/72 | HR 85 | Ht 64.0 in | Wt 139.2 lb

## 2016-10-31 DIAGNOSIS — I10 Essential (primary) hypertension: Secondary | ICD-10-CM | POA: Diagnosis not present

## 2016-10-31 DIAGNOSIS — I481 Persistent atrial fibrillation: Secondary | ICD-10-CM | POA: Diagnosis not present

## 2016-10-31 DIAGNOSIS — R600 Localized edema: Secondary | ICD-10-CM

## 2016-10-31 DIAGNOSIS — R001 Bradycardia, unspecified: Secondary | ICD-10-CM | POA: Diagnosis not present

## 2016-10-31 DIAGNOSIS — I4819 Other persistent atrial fibrillation: Secondary | ICD-10-CM

## 2016-10-31 MED ORDER — DIGOXIN 62.5 MCG PO TABS: 0.0625 mg | ORAL_TABLET | Freq: Every day | ORAL | 5 refills | Status: DC

## 2016-10-31 MED ORDER — FUROSEMIDE 40 MG PO TABS
40.0000 mg | ORAL_TABLET | Freq: Every day | ORAL | 3 refills | Status: AC
Start: 2016-10-31 — End: ?

## 2016-10-31 NOTE — Patient Instructions (Addendum)
Medication Instructions:  Your physician has recommended you make the following change in your medication:  1) REDUCE Lasix to 40mg  daily 2) START Digoxin 0.0625mg  daily. An Rx has been sent to your pharmacy   Labwork: Your physician recommends that you return for lab work on 11/05/16.  Please have your labs drawn at any [facility]  Testing/Procedures: None ordered  Follow-Up: Follow up as planned with Dr.[place]   Any Other Special Instructions Will Be Listed Below (If Applicable). Let us know if you develop any dizziness     If you need a refill on your cardiac medications before your next appointment, please call your pharmacy.

## 2016-10-31 NOTE — Progress Notes (Signed)
Cardiology Office Note    Date:  11/01/2016   ID:  Rachel Hickman, DOB Jan 15, 1917, MRN [id number]  PCP:  Kirt Boys, DO  Cardiologist:  Dr. Duke Salvia   Chief Complaint  Patient presents with  . Follow-up    seen for Dr. Duke Salvia, afib with RVR and LE edema    History of Present Illness:  Rachel Hickman is a 83 y.o. female with PMH of HTN, bradycardia, moderate TR/MR. She has been taking Lasix for lower extremity edema. She continued to have intermittent shortness of breath as well. Her Lasix was increased to 40 mg daily from 20 mg daily. Due to persistent lower extremity edema, she was transitioned to Bumex but had intravascular volume depletion requiring hospitalization. On her last office visit on 10/04/2016, she was noted to be in atrial fibrillation with heart rate of 136. She was also noted to be fluid overloaded on physical exam. Her last office visit was on 10/10/2016, her Lasix was increased to 40 mg twice a day for 3 days before going back to 40 mg daily thereafter. He has been instructed to take an additional dose of Lasix if her weight increased by more than 2 pounds overnight or 5 pounds in a single week.  I last saw the patient in the office on 10/17/2016, she is still on 40 mg Lasix every Monday, Wednesday, Friday and the 20 mg Lasix all other days. She still has at least 2+ lower extremity edema, I will increase the Lasix to 40 mg daily. She will obtain a basic metabolic panel next Monday for assessment of the renal function and electrolyte. As for her heart rate, and he seems to be well-controlled at rest, physical exam does show that she is still in atrial fibrillation at this point. She does have a history of bradycardia, I will add low dose digoxin for better rate control. She will also drawn digoxin level next week as well. Otherwise, she is only alerted to self, she has significant dementia. Unfortunately the daughter is taking care of her.   Past Medical History:    Diagnosis Date  . Acute kidney injury (HCC)   . Arthritis   . Bradycardia   . Confusion   . Disorder of pineal gland   . Hypertension     Past Surgical History:  Procedure Laterality Date  . CESAREAN SECTION      Current Medications: Outpatient Medications Prior to Visit  Medication Sig Dispense Refill  . AMBULATORY NON FORMULARY MEDICATION Transport wheelchair with leg rests 1 each 0  . apixaban (ELIQUIS) 2.5 MG TABS tablet Take 1 tablet (2.5 mg total) by mouth 2 (two) times daily. 60 tablet 6  . feeding supplement, ENSURE COMPLETE, (ENSURE COMPLETE) LIQD Take 237 mLs by mouth 2 (two) times daily between meals.    Marland Kitchen KLOR-CON 10 10 MEQ tablet TAKE 1 TABLET BY MOUTH EVERY DAY 30 tablet 2  . metoprolol (LOPRESSOR) 50 MG tablet Take 1 tablet (50 mg total) by mouth 2 (two) times daily. 180 tablet 3  . furosemide (LASIX) 40 MG tablet Take 1 tablet (40mg ) by mouth MWF, take 1/2 tablet (20mg ) all other days. 90 tablet 3  . amLODipine (NORVASC) 5 MG tablet Take 1 tablet (5 mg total) by mouth daily. 180 tablet 3   No facility-administered medications prior to visit.      Allergies:   Patient has no known allergies.   Social History   Social History  . Marital status: Widowed  Spouse name: N/A  . Number of children: N/A  . Years of education: N/A   Social History Main Topics  . Smoking status: Never Smoker  . Smokeless tobacco: Never Used  . Alcohol use No  . Drug use: No  . Sexual activity: No   Other Topics Concern  . None   Social History Narrative   Diet- Yes, Healthy heart   Caffeine- Yes   Married- 1946, Widowed   House- Yes, moved in with daughter (11/30/14)   Pets- No   Current/past profession-   Exercise- Infrequently   Living will- No   DNR-No   POA/HPOA-No           Family History:  The patient's family history includes Cancer in her brother, brother, and mother; Diabetes in her brother.   ROS:   Please see the history of present illness.    ROS  All other systems reviewed and are negative.   PHYSICAL EXAM:   VS:  BP 127/72   Pulse 85   Ht 5\' 4"  (1.626 m)   Wt 139 lb 3.2 oz (63.1 kg)   BMI 23.89 kg/m    GEN: Well nourished, well developed, in no acute distress  HEENT: normal  Neck: no JVD, carotid bruits, or masses Cardiac: Irregularly irregular; no murmurs, rubs, or gallops. 2-3+ LE pitting edema  Respiratory:  clear to auscultation bilaterally, normal work of breathing GI: soft, nontender, nondistended, + BS MS: no deformity or atrophy  Skin: warm and dry, no rash Neuro:  Alert and Oriented x 3, Strength and sensation are intact Psych: euthymic mood, full affect  Wt Readings from Last 3 Encounters:  10/31/16 139 lb 3.2 oz (63.1 kg)  10/17/16 147 lb 12.8 oz (67 kg)  10/10/16 148 lb 12.8 oz (67.5 kg)      Studies/Labs Reviewed:   EKG:  EKG is not ordered today.    Recent Labs: 05/25/2016: ALT 11; TSH 1.86 10/04/2016: Magnesium 2.2 10/17/2016: BUN 30; Creat 1.40; Hemoglobin 12.6; Platelets 189; Potassium 4.0; Sodium 147   Lipid Panel No results found for: CHOL, TRIG, HDL, CHOLHDL, VLDL, LDLCALC, LDLDIRECT  Additional studies/ records that were reviewed today include:   Echo 03/01/2015 LV EF: 50% - 55%  - Left ventricle: Severe basal septal hypertrophy. The cavity size was moderately dilated. Systolic function was normal. The estimated ejection fraction was in the range of 50% to 55%. - Aortic valve: There was mild regurgitation. - Mitral valve: Moderate eccentric MR directed posteriorly mechanism not clear ? restricted posterior leaflet There was moderate regurgitation. - Left atrium: The atrium was severely dilated. - Right atrium: The atrium was mildly dilated. - Atrial septum: No defect or patent foramen ovale was identified. - Tricuspid valve: There was moderate regurgitation.   ASSESSMENT:    1. Persistent atrial fibrillation (HCC)   2. Bradycardia   3. Essential hypertension   4.  Bilateral leg edema      PLAN:  In order of problems listed above:  1. Persistent atrial fibrillation: Appears to be continually atrial fibrillation at this point based on physical exam. Will need better heart rate control, given history of bradycardia, will start on 0.0625 of digoxin. She will obtain digoxin level next week. 2. Acute on chronic diastolic heart failure: She denies any chest discomfort. She continued to have lower extremity edema, I will increase Lasix to 40 mg daily. 3. Hypertension: Blood pressure well controlled.   Medication Adjustments/Labs and Tests Ordered: Current medicines are reviewed at length  with the patient today.  Concerns regarding medicines are outlined above.  Medication changes, Labs and Tests ordered today are listed in the Patient Instructions below. Patient Instructions  Medication Instructions:  Your physician has recommended you make the following change in your medication:  1) REDUCE Lasix to 40mg  daily 2) START Digoxin 0.0625mg  daily. An Rx has been sent to your pharmacy   Labwork: Your physician recommends that you return for lab work on 11/05/16.  Please have your labs drawn at any [facility]  Testing/Procedures: None ordered  Follow-Up: Follow up as planned with Dr.[place]   Any Other Special Instructions Will Be Listed Below (If Applicable). Let us know if you develop any dizziness     If you need a refill on your cardiac medications before your next appointment, please call your pharmacy.      Ramond Dial, [place]  11/01/2016 1:11 AM    [facility] [address] [place], [place], [place]  [address] Phone: [phone]; Fax: [phone]

## 2016-11-01 ENCOUNTER — Encounter: Payer: Self-pay | Admitting: Physician Assistant

## 2016-11-02 ENCOUNTER — Other Ambulatory Visit: Payer: Self-pay

## 2016-11-02 MED ORDER — APIXABAN 2.5 MG PO TABS: 2.5000 mg | ORAL_TABLET | Freq: Two times a day (BID) | ORAL | 6 refills | Status: AC

## 2016-11-09 ENCOUNTER — Telehealth: Payer: Self-pay | Admitting: *Deleted

## 2016-11-09 NOTE — Telephone Encounter (Signed)
Received fax from pharmacy Digoxin 0.0625 mg not covered by insurance Spoke with pharmacy and the Digoxin 0.125 mg 1/2 tablet daily is covered. New Rx for digoxin 125 mcg 1/2 tablet daily #45 with 1 refill called in  H. Meng PA aware of change made, agreeable with plan

## 2016-11-12 ENCOUNTER — Other Ambulatory Visit: Payer: Self-pay | Admitting: Internal Medicine

## 2016-11-21 ENCOUNTER — Ambulatory Visit: Payer: Medicare Other

## 2016-11-29 ENCOUNTER — Encounter: Payer: Self-pay | Admitting: Cardiovascular Disease

## 2016-11-29 ENCOUNTER — Ambulatory Visit (INDEPENDENT_AMBULATORY_CARE_PROVIDER_SITE_OTHER): Payer: Medicare Other | Admitting: Cardiovascular Disease

## 2016-11-29 VITALS — BP 108/56 | HR 90 | Ht 63.0 in | Wt 131.8 lb

## 2016-11-29 DIAGNOSIS — I340 Nonrheumatic mitral (valve) insufficiency: Secondary | ICD-10-CM

## 2016-11-29 DIAGNOSIS — I481 Persistent atrial fibrillation: Secondary | ICD-10-CM

## 2016-11-29 DIAGNOSIS — I4819 Other persistent atrial fibrillation: Secondary | ICD-10-CM

## 2016-11-29 DIAGNOSIS — Z79899 Other long term (current) drug therapy: Secondary | ICD-10-CM

## 2016-11-29 DIAGNOSIS — I110 Hypertensive heart disease with heart failure: Secondary | ICD-10-CM

## 2016-11-29 NOTE — Patient Instructions (Addendum)
Medication Instructions:  Your physician recommends that you continue on your current medications as directed. Please refer to the Current Medication list given to you today.  Labwork: BMET AT [facility]   Testing/Procedures: NONE  Follow-Up: Your physician recommends that you schedule a follow-up appointment in: 3 MONTH OV  If you need a refill on your cardiac medications before your next appointment, please call your pharmacy.

## 2016-11-29 NOTE — Progress Notes (Signed)
Cardiology Office Note   Date:  11/29/2016   ID:  Rachel Hickman, DOB [birth year], MRN [id number]  PCP:  Kirt Boys, DO  Cardiologist:   Chilton Si, MD   No chief complaint on file.    History of Present Illness: Rachel Hickman is a 83 y.o. female with paroxysmal atrial fibrillation, hypertension, bradycardia and moderate TR/MR who presents for follow up.  She is accompanied by her daughter who provides much of the history.  She was initially seen 07/2015 due to lower extremity edema.  Her lasix had been increased to 40 mg without improvement.  She previously used bumex but had intravascular volume depletion requiring hospitalization.  Prior to that she had an echo 02/2015 that showed LVEF 50-55% with moderate eccentric MR with a restricted posteriior leaflet and moderate TR.  She was noted ot be in atrial fibrillation 09/2016 and she was volume overloaded.  Lasix was increased to bid for 3 days.  Clonidine was discontinued and metoprolol was increased.  She last saw Azalee Course 10/2016 and continued to have lower extremity edema so lasix was increased to 40 mg daily.  Digoxin was started for improved rate control.  Since that appointment Rachel Hickman has been doing well.  Her daughter notes that her breathing has improved and her heart rate has been stable.  However, she never started digoxin due to difficulty getting it from the pharmacy.  She hasn't been eating or drinking well.  She denies lower extremity edema, orthopnea or PND.  Her daughter has noticed that her potassium tablets come out whole in her stool.   Past Medical History:  Diagnosis Date  . Acute kidney injury (HCC)   . Arthritis   . Bradycardia   . Confusion   . Disorder of pineal gland   . Hypertension     Past Surgical History:  Procedure Laterality Date  . CESAREAN SECTION       Current Outpatient Prescriptions  Medication Sig Dispense Refill  . AMBULATORY NON FORMULARY MEDICATION Transport wheelchair with leg  rests 1 each 0  . apixaban (ELIQUIS) 2.5 MG TABS tablet Take 1 tablet (2.5 mg total) by mouth 2 (two) times daily. 60 tablet 6  . feeding supplement, ENSURE COMPLETE, (ENSURE COMPLETE) LIQD Take 237 mLs by mouth 2 (two) times daily between meals.    . furosemide (LASIX) 40 MG tablet Take 1 tablet (40 mg total) by mouth daily. 90 tablet 3  . KLOR-CON 10 10 MEQ tablet TAKE 1 TABLET BY MOUTH EVERY DAY 30 tablet 2  . metoprolol (LOPRESSOR) 50 MG tablet Take 1 tablet (50 mg total) by mouth 2 (two) times daily. 180 tablet 3  . amLODipine (NORVASC) 5 MG tablet Take 1 tablet (5 mg total) by mouth daily. 180 tablet 3   No current facility-administered medications for this visit.     Allergies:   Patient has no known allergies.    Social History:  The patient  reports that she has never smoked. She has never used smokeless tobacco. She reports that she does not drink alcohol or use drugs.   Family History:  The patient's family history includes Cancer in her brother, brother, and mother; Diabetes in her brother.    ROS:  Please see the history of present illness.   Otherwise, review of systems are positive for none.   All other systems are reviewed and negative.    PHYSICAL EXAM: VS:  BP (!) 108/56   Pulse 90  Ht 5\' 3"  (1.6 m)   Wt 59.8 kg (131 lb 12.8 oz)   BMI 23.35 kg/m  , BMI Body mass index is 23.35 kg/m. GENERAL:  Well appearing HEENT:  Pupils equal round and reactive, fundi not visualized, oral mucosa unremarkable NECK:  No JVD sitting upright.  Waveform with prominent CV waves, carotid upstroke brisk and symmetric, no bruits LYMPHATICS:  No cervical adenopathy LUNGS:  Clear to auscultation bilaterally HEART:  Irregularly irregular.  PMI not displaced or sustained,S1 and S2 within normal limits, no S3, no S4, no clicks, no rubs,III/VI holosytolic murmur at the apex ABD:  Flat, positive bowel sounds normal in frequency in pitch, no bruits, no rebound, no guarding, no midline  pulsatile mass, no hepatomegaly, no splenomegaly EXT:  2 plus pulses throughout, no edema, no cyanosis no clubbing SKIN:  No rashes no nodules NEURO:  Cranial nerves II through XII grossly intact, motor grossly intact throughout PSYCH:  Cognitively intact, oriented to person place and time   EKG:  EKG is ordered today. The ekg ordered 07/27/15 demonstrates sinus rhythm at 71 bpm.  LVH with secondary repolarization abnormalities.  One PVC. 11/29/16: Atrial fibirllation.  Rate 90 bpm  Echo 03/01/15: Study Conclusions  - Left ventricle: Severe basal septal hypertrophy. The cavity size was moderately dilated. Systolic function was normal. The estimated ejection fraction was in the range of 50% to 55%. - Aortic valve: There was mild regurgitation. - Mitral valve: Moderate eccentric MR directed posteriorly mechanism not clear ? restricted posterior leaflet There was moderate regurgitation. - Left atrium: The atrium was severely dilated. - Right atrium: The atrium was mildly dilated. - Atrial septum: No defect or patent foramen ovale was identified. - Tricuspid valve: There was moderate regurgitation.   Recent Labs: 05/25/2016: ALT 11; TSH 1.86 10/04/2016: Magnesium 2.2 10/17/2016: BUN 30; Creat 1.40; Hemoglobin 12.6; Platelets 189; Potassium 4.0; Sodium 147    Lipid Panel No results found for: CHOL, TRIG, HDL, CHOLHDL, VLDL, LDLCALC, LDLDIRECT    Wt Readings from Last 3 Encounters:  11/29/16 59.8 kg (131 lb 12.8 oz)  10/31/16 63.1 kg (139 lb 3.2 oz)  10/17/16 67 kg (147 lb 12.8 oz)      Other studies Reviewed: Additional studies/ records that were reviewed today include: \Review of the above records demonstrates:  Please see elsewhere in the note.     ASSESSMENT AND PLAN:  # Atrial fibrillation: Rachel Hickman remains in atrial fibrillation.  Rates are well-controlled.  Continue metoprolol and Eliquis.  She isn't taking digoxin and rate is controlled.  Check BMP.  #  Moderate MR/TR, LE edema: Rachel Hickman's volume status has improved.  Check BMP given that lasix was increased.    # Hypertension: BP is controlled.  Continue metoprolol and amlodipine.    Current medicines are reviewed at length with the patient today.  The patient does not have concerns regarding medicines.  The following changes have been made: None  Labs/ tests ordered today include:   Orders Placed This Encounter  Procedures  . Basic metabolic panel  . EKG 12-Lead     Disposition:   FU with Rachel Sindt C. Duke Salviaandolph, MD in 3 months   Signed, Chilton Siiffany Fidelis, MD  11/29/2016 10:08 PM    [facility]

## 2016-11-30 LAB — BASIC METABOLIC PANEL
BUN: 18 mg/dL (ref 7–25)
CO2: 28 mmol/L (ref 20–31)
Calcium: 8.5 mg/dL — ABNORMAL LOW (ref 8.6–10.4)
Chloride: 103 mmol/L (ref 98–110)
Creat: 1.05 mg/dL — ABNORMAL HIGH (ref 0.60–0.88)
Glucose, Bld: 113 mg/dL — ABNORMAL HIGH (ref 65–99)
POTASSIUM: 4 mmol/L (ref 3.5–5.3)
SODIUM: 144 mmol/L (ref 135–146)

## 2016-12-04 ENCOUNTER — Ambulatory Visit: Payer: Medicare Other | Admitting: Nurse Practitioner

## 2016-12-06 ENCOUNTER — Ambulatory Visit (INDEPENDENT_AMBULATORY_CARE_PROVIDER_SITE_OTHER): Payer: Medicare Other

## 2016-12-06 VITALS — BP 120/82 | HR 81 | Temp 97.5°F | Ht 63.0 in | Wt 128.4 lb

## 2016-12-06 DIAGNOSIS — Z0000 Encounter for general adult medical examination without abnormal findings: Secondary | ICD-10-CM

## 2016-12-06 NOTE — Patient Instructions (Addendum)
Ms. Rachel Hickman , Thank you for taking time to come for your Medicare Wellness Visit. I appreciate your ongoing commitment to your health goals. Please review the following plan we discussed and let me know if I can assist you in the future.   These are the goals we discussed: Goals    . <enter goal here>          Starting 12/06/16, I will maintain my current weight by eating the correct foods.        This is a list of the screening recommended for you and due dates:  Health Maintenance  Topic Date Due  . Shingles Vaccine  11/22/2017*  . Tetanus Vaccine  11/22/2017*  . Flu Shot  Completed  . DEXA scan (bone density measurement)  Completed  . Pneumonia vaccines  Completed  *Topic was postponed. The date shown is not the original due date.  Preventive Care for Adults  A healthy lifestyle and preventive care can promote health and wellness. Preventive health guidelines for adults include the following key practices.  . A routine yearly physical is a good way to check with your health care provider about your health and preventive screening. It is a chance to share any concerns and updates on your health and to receive a thorough exam.  . Visit your dentist for a routine exam and preventive care every 6 months. Brush your teeth twice a day and floss once a day. Good oral hygiene prevents tooth decay and gum disease.  . The frequency of eye exams is based on your age, health, family medical history, use  of contact lenses, and other factors. Follow your health care provider's ecommendations for frequency of eye exams.  . Eat a healthy diet. Foods like vegetables, fruits, whole grains, low-fat dairy products, and lean protein foods contain the nutrients you need without too many calories. Decrease your intake of foods high in solid fats, added sugars, and salt. Eat the right amount of calories for you. Get information about a proper diet from your health care provider, if necessary.  . Regular  physical exercise is one of the most important things you can do for your health. Most adults should get at least 150 minutes of moderate-intensity exercise (any activity that increases your heart rate and causes you to sweat) each week. In addition, most adults need muscle-strengthening exercises on 2 or more days a week.  Silver Sneakers may be a benefit available to you. To determine eligibility, you may visit the website: www.silversneakers.com or contact program at [phone] Mon-Fri between 8AM-8PM.   . Maintain a healthy weight. The body mass index (BMI) is a screening tool to identify possible weight problems. It provides an estimate of body fat based on height and weight. Your health care provider can find your BMI and can help you achieve or maintain a healthy weight.   For adults 20 years and older: ? A BMI below 18.5 is considered underweight. ? A BMI of 18.5 to 24.9 is normal. ? A BMI of 25 to 29.9 is considered overweight. ? A BMI of 30 and above is considered obese.   . Maintain normal blood lipids and cholesterol levels by exercising and minimizing your intake of saturated fat. Eat a balanced diet with plenty of fruit and vegetables. Blood tests for lipids and cholesterol should begin at age 81 and be repeated every 5 years. If your lipid or cholesterol levels are high, you are over 50, or you are at high risk  for heart disease, you may need your cholesterol levels checked more frequently. Ongoing high lipid and cholesterol levels should be treated with medicines if diet and exercise are not working.  . If you smoke, find out from your health care provider how to quit. If you do not use tobacco, please do not start.  . If you choose to drink alcohol, please do not consume more than 2 drinks per day. One drink is considered to be 12 ounces (355 mL) of beer, 5 ounces (148 mL) of wine, or 1.5 ounces (44 mL) of liquor.  . If you are 47-49 years old, ask your health care provider if  you should take aspirin to prevent strokes.  . Use sunscreen. Apply sunscreen liberally and repeatedly throughout the day. You should seek shade when your shadow is shorter than you. Protect yourself by wearing long sleeves, pants, a wide-brimmed hat, and sunglasses year round, whenever you are outdoors.  . Once a month, do a whole body skin exam, using a mirror to look at the skin on your back. Tell your health care provider of new moles, moles that have irregular borders, moles that are larger than a pencil eraser, or moles that have changed in shape or color.

## 2016-12-06 NOTE — Progress Notes (Signed)
Quick Notes   Health Maintenance:  None    Abnormal Screen: MMSE 25/30 Failed Clock test    Patient Concerns:  Pt's daughter would like to discuss the option of possibly getting home health to assist her mother in the evenings/night.     Nurse Concerns:  None

## 2016-12-06 NOTE — Progress Notes (Signed)
Subjective:   Rachel Hickman is a 83 y.o. female who presents for an Initial Medicare Annual Wellness Visit.  Review of Systems     Cardiac Risk Factors include: advanced age (>2155men, 37>65 women);sedentary lifestyle;family history of premature cardiovascular disease     Objective:    Today's Vitals   12/06/16 1548  BP: 120/82  Pulse: 81  Temp: 97.5 F (36.4 C)  TempSrc: Oral  SpO2: 97%  Weight: 128 lb 6.4 oz (58.2 kg)  Height: 5\' 3"  (1.6 m)   Body mass index is 22.75 kg/m.   Current Medications (verified) Outpatient Encounter Prescriptions as of 12/06/2016  Medication Sig  . AMBULATORY NON FORMULARY MEDICATION Transport wheelchair with leg rests  . amLODipine (NORVASC) 5 MG tablet Take 5 mg by mouth daily.  Marland Kitchen. apixaban (ELIQUIS) 2.5 MG TABS tablet Take 1 tablet (2.5 mg total) by mouth 2 (two) times daily.  . feeding supplement, ENSURE COMPLETE, (ENSURE COMPLETE) LIQD Take 237 mLs by mouth 2 (two) times daily between meals.  . furosemide (LASIX) 40 MG tablet Take 1 tablet (40 mg total) by mouth daily.  Marland Kitchen. KLOR-CON 10 10 MEQ tablet TAKE 1 TABLET BY MOUTH EVERY DAY  . metoprolol (LOPRESSOR) 50 MG tablet Take 1 tablet (50 mg total) by mouth 2 (two) times daily.  . [DISCONTINUED] amLODipine (NORVASC) 5 MG tablet Take 1 tablet (5 mg total) by mouth daily.   No facility-administered encounter medications on file as of 12/06/2016.     Allergies (verified) Patient has no known allergies.   History: Past Medical History:  Diagnosis Date  . A-fib (HCC)   . Acute kidney injury (HCC)   . Arthritis   . Bradycardia   . Confusion   . Disorder of pineal gland   . Hypertension    Past Surgical History:  Procedure Laterality Date  . CESAREAN SECTION     Family History  Problem Relation Age of Onset  . Cancer Brother   . Cancer Brother   . Cancer Mother   . Diabetes Brother   . Hypertension Brother   . Dementia Brother   . Arthritis Father   . Hypertension     Social  History   Occupational History  . Not on file.   Social History Main Topics  . Smoking status: Never Smoker  . Smokeless tobacco: Never Used  . Alcohol use No  . Drug use: No  . Sexual activity: No    Tobacco Counseling Counseling given: No   Activities of Daily Living In your present state of health, do you have any difficulty performing the following activities: 12/06/2016  Hearing? Y  Vision? Y  Difficulty concentrating or making decisions? Y  Walking or climbing stairs? Y  Dressing or bathing? Y  Doing errands, shopping? Y  Preparing Food and eating ? Y  Using the Toilet? Y  In the past six months, have you accidently leaked urine? Y  Do you have problems with loss of bowel control? Y  Managing your Medications? Y  Managing your Finances? Y  Housekeeping or managing your Housekeeping? Y  Some recent data might be hidden    Immunizations and Health Maintenance Immunization History  Administered Date(s) Administered  . Influenza,inj,Quad PF,36+ Mos 11/28/2014, 07/15/2015, 09/03/2016  . Pneumococcal Conjugate-13 09/03/2016  . Pneumococcal Polysaccharide-23 11/28/2014   There are no preventive care reminders to display for this patient.  Patient Care Team: Kirt BoysMonica Carter, DO as PCP - General (Internal Medicine)  Indicate any recent Medical  Services you may have received from other than [facility] providers in the past year (date may be approximate).     Assessment:   This is a routine wellness examination for [place].  Hearing/Vision screen Hearing Screening Comments: Last hearing screen was done many years ago. Pt has had a decrease in hearing but does not want to pursue anything at this time.  Vision Screening Comments: Last eye exam was done several years ago. Pt's daughter will let us know when she needs the referral.   Dietary issues and exercise activities discussed: Current Exercise Habits: The patient does not participate in regular exercise at present  Goals     . <enter goal here>          Starting 12/06/16, I will maintain my current weight by eating the correct foods.       Depression Screen PHQ 2/9 Scores 12/06/2016 05/25/2016 03/25/2015  PHQ - 2 Score 0 0 0    Fall Risk Fall Risk  12/06/2016 09/03/2016 05/25/2016 08/31/2015 07/15/2015  Falls in the past year? Yes No Yes No Yes  Number falls in past yr: 2 or more - 2 or more - 2 or more  Injury with Fall? No - No - Yes  Risk Factor Category  High Fall Risk - - - -  Risk for fall due to : Impaired mobility;Impaired balance/gait;History of fall(s);Mental status change - - - -    Cognitive Function: MMSE - Mini Mental State Exam 12/06/2016  Orientation to time 2  Orientation to Place 3  Registration 3  Attention/ Calculation 5  Recall 3  Language- name 2 objects 2  Language- repeat 1  Language- follow 3 step command 3  Language- read & follow direction 1  Write a sentence 1  Copy design 1  Total score 25        Screening Tests Health Maintenance  Topic Date Due  . ZOSTAVAX  11/22/2017 (Originally [birth year]/15/1978)  . TETANUS/TDAP  11/22/2017 (Originally 12/15/1935)  . INFLUENZA VACCINE  Completed  . DEXA SCAN  Completed  . PNA vac Low Risk Adult  Completed      Plan:    I have personally reviewed and addressed the Medicare Annual Wellness questionnaire and have noted the following in the patient's chart:  A. Medical and social history B. Use of alcohol, tobacco or illicit drugs  C. Current medications and supplements D. Functional ability and status E.  Nutritional status F.  Physical activity G. Advance directives H. List of other physicians I.  Hospitalizations, surgeries, and ER visits in previous 12 months J.  Vitals K. Screenings to include hearing, vision, cognitive, depression L. Referrals and appointments - none  In addition, I have reviewed and discussed with patient certain preventive protocols, quality metrics, and best practice recommendations. A written  personalized care plan for preventive services as well as general preventive health recommendations were provided to patient.  See attached scanned questionnaire for additional information.   Signed,   Nilda Calamity, LPN Health Advisor  I reviewed health advisors note, was available for consultation and agree with documentation and plan.  Janene Harvey. Biagio Borg  [facility] [phone] 8 am - 5 pm) [phone] (after hours)

## 2016-12-17 ENCOUNTER — Ambulatory Visit: Payer: Medicare Other | Admitting: Nurse Practitioner

## 2016-12-17 ENCOUNTER — Telehealth: Payer: Self-pay | Admitting: *Deleted

## 2016-12-17 ENCOUNTER — Encounter: Payer: Self-pay | Admitting: Internal Medicine

## 2016-12-17 NOTE — Telephone Encounter (Signed)
Rachel Hickman, daughter called and left message on Clinical Intake Line stating that patient is 83 years old and wants to know what is the next necessary appointment, that she does not want to be taking her in and out at her age.   I tried calling daughter back but I get a message stating the the wireless caller is unavailable to to try my call again later.  Patient does have an appointment today with Shanda BumpsJessica for a follow up.

## 2017-01-03 ENCOUNTER — Telehealth: Payer: Self-pay

## 2017-01-03 DIAGNOSIS — R410 Disorientation, unspecified: Secondary | ICD-10-CM

## 2017-01-03 NOTE — Telephone Encounter (Signed)
Message left on clinical intake voicemail:   Patient's daughter is concerned that her mother has a UTI due to recent behavior changes. Rachel Hickman would like to bring in a sample for her mother  Please advise

## 2017-01-03 NOTE — Telephone Encounter (Signed)
Do UA and culture. 

## 2017-01-03 NOTE — Telephone Encounter (Signed)
Future orders placed. Spoke with patient's daughter. Rachel Hickman will try to pick up sterile cup tomorrow and drop off by 1 pm. Rachel Hickman was informed the lab will be closed after 2 pm.   In the event that Rachel Hickman is not able to bring urine sample back it was recommended that she take her mother to Urgent care for evaluation and treatment of symptoms, Rachel Hickman verbalized understanding of recommendation

## 2017-01-10 ENCOUNTER — Other Ambulatory Visit: Payer: Self-pay

## 2017-01-10 ENCOUNTER — Telehealth: Payer: Self-pay

## 2017-01-10 ENCOUNTER — Other Ambulatory Visit: Payer: Medicare Other

## 2017-01-10 DIAGNOSIS — R410 Disorientation, unspecified: Secondary | ICD-10-CM | POA: Diagnosis not present

## 2017-01-10 NOTE — Telephone Encounter (Signed)
Tried to call patients daughter, unable to leave message (no voicemail). I will try to call later.

## 2017-01-10 NOTE — Telephone Encounter (Signed)
Walk-in clinical intake form completed:  Patient is requesting to change potassium tablet to capsule(sprinkle). Patient has difficulty swallowing tablets at times.  Please advise

## 2017-01-10 NOTE — Telephone Encounter (Signed)
Please order the potassium in a capsule form.

## 2017-01-11 LAB — URINALYSIS
BILIRUBIN URINE: NEGATIVE
GLUCOSE, UA: NEGATIVE
HGB URINE DIPSTICK: NEGATIVE
Ketones, ur: NEGATIVE
Leukocytes, UA: NEGATIVE
NITRITE: NEGATIVE
PH: 5.5 (ref 5.0–8.0)
Specific Gravity, Urine: 1.014 (ref 1.001–1.035)

## 2017-01-11 MED ORDER — POTASSIUM CHLORIDE ER 10 MEQ PO CPCR: 10.0000 meq | ORAL_CAPSULE | Freq: Every day | ORAL | 1 refills | Status: AC

## 2017-01-11 NOTE — Telephone Encounter (Signed)
Patient daughter notified and agreed. Faxed Rx to pharmacy.  

## 2017-01-12 LAB — URINE CULTURE

## 2017-01-17 ENCOUNTER — Ambulatory Visit: Payer: Medicare Other | Admitting: Nurse Practitioner

## 2017-01-20 DIAGNOSIS — 419620001 Death: Secondary | SNOMED CT | POA: Diagnosis not present

## 2017-01-20 DEATH — deceased

## 2017-02-28 ENCOUNTER — Ambulatory Visit: Payer: Medicare Other | Admitting: Cardiovascular Disease
